# Patient Record
Sex: Male | Born: 1990 | Race: Black or African American | Hispanic: No | Marital: Single | Smoking: Never smoker
Health system: Southern US, Community
[De-identification: ages and names within clinical notes are randomized; demographics above are authoritative.]

## PROBLEM LIST (undated history)

## (undated) DIAGNOSIS — N189 Chronic kidney disease, unspecified: Secondary | ICD-10-CM

## (undated) DIAGNOSIS — I10 Essential (primary) hypertension: Secondary | ICD-10-CM

## (undated) DIAGNOSIS — E119 Type 2 diabetes mellitus without complications: Secondary | ICD-10-CM

## (undated) HISTORY — PX: EYE SURGERY: SHX253

## (undated) HISTORY — DX: Chronic kidney disease, unspecified: N18.9

## (undated) HISTORY — PX: FINGER SURGERY: SHX640

---

## 2015-06-16 ENCOUNTER — Encounter (HOSPITAL_COMMUNITY): Payer: Self-pay | Admitting: Emergency Medicine

## 2015-06-16 ENCOUNTER — Emergency Department (INDEPENDENT_AMBULATORY_CARE_PROVIDER_SITE_OTHER)
Admission: EM | Admit: 2015-06-16 | Discharge: 2015-06-16 | Disposition: A | Discharge status: Nursing Home (Includes ICF) | Payer: Medicaid Other | Source: Home / Self Care | Attending: Emergency Medicine | Admitting: Emergency Medicine

## 2015-06-16 ENCOUNTER — Emergency Department (HOSPITAL_COMMUNITY)
Admission: EM | Admit: 2015-06-16 | Discharge: 2015-06-16 | Disposition: A | Discharge status: Home / Self Care | Payer: Medicaid Other | Attending: Emergency Medicine | Admitting: Emergency Medicine

## 2015-06-16 DIAGNOSIS — E162 Hypoglycemia, unspecified: Secondary | ICD-10-CM

## 2015-06-16 DIAGNOSIS — R410 Disorientation, unspecified: Secondary | ICD-10-CM | POA: Diagnosis not present

## 2015-06-16 DIAGNOSIS — E160 Drug-induced hypoglycemia without coma: Secondary | ICD-10-CM

## 2015-06-16 DIAGNOSIS — H43392 Other vitreous opacities, left eye: Secondary | ICD-10-CM | POA: Insufficient documentation

## 2015-06-16 DIAGNOSIS — R531 Weakness: Secondary | ICD-10-CM | POA: Insufficient documentation

## 2015-06-16 DIAGNOSIS — T383X5A Adverse effect of insulin and oral hypoglycemic [antidiabetic] drugs, initial encounter: Secondary | ICD-10-CM

## 2015-06-16 DIAGNOSIS — Z794 Long term (current) use of insulin: Secondary | ICD-10-CM | POA: Diagnosis not present

## 2015-06-16 DIAGNOSIS — E11649 Type 2 diabetes mellitus with hypoglycemia without coma: Secondary | ICD-10-CM | POA: Diagnosis present

## 2015-06-16 HISTORY — DX: Type 2 diabetes mellitus without complications: E11.9

## 2015-06-16 LAB — CBG MONITORING, ED
GLUCOSE-CAPILLARY: 169 mg/dL — AB (ref 65–99)
GLUCOSE-CAPILLARY: 310 mg/dL — AB (ref 65–99)

## 2015-06-16 LAB — GLUCOSE, CAPILLARY
GLUCOSE-CAPILLARY: 57 mg/dL — AB (ref 65–99)
Glucose-Capillary: 17 mg/dL — CL (ref 65–99)
Glucose-Capillary: 25 mg/dL — CL (ref 65–99)

## 2015-06-16 MED ORDER — DEXTROSE 50 % IV SOLN
INTRAVENOUS | Status: AC
  Filled 2015-06-16: qty 50

## 2015-06-16 MED ORDER — GLUCOSE 40 % PO GEL
ORAL | Status: AC
  Filled 2015-06-16: qty 1

## 2015-06-16 MED ORDER — INSULIN GLARGINE 100 UNITS/ML SOLOSTAR PEN: 40.0000 [IU] | PEN_INJECTOR | Freq: Every day | SUBCUTANEOUS | Status: DC

## 2015-06-16 MED ORDER — GLUCOSE 40 % PO GEL
ORAL | Status: AC
Start: 2015-06-16 — End: 2015-06-16
  Filled 2015-06-16: qty 1

## 2015-06-16 MED ORDER — INSULIN ASPART 100 UNIT/ML FLEXPEN: 12.0000 [IU] | PEN_INJECTOR | Freq: Three times a day (TID) | SUBCUTANEOUS | Status: DC

## 2015-06-16 NOTE — ED Provider Notes (Signed)
CSN: [id number]     Arrival date & time 06/16/15  1401 History   First MD Initiated Contact with Patient 06/16/15 1403     Chief Complaint  Patient presents with  . Hypoglycemia   24 y/o [place] male with Type 1 Diabetes Mellitus arrived to ED after being found confused at urgent care with CBG of 17. He was given glucose paste with improvement in mental status. He reports that he followed his normal routine until last night taking 32U Lantus last night and 12U Novolog with breakfast this morning. His breakfast was 1 egg and 2 pieces of whole wheat toast. He was visiting Urgent Care for medication refills as he has no regular PCP. He has been feeling well his only complaint is worsening of his chronic poor left eyesight with increased floaters and decreased visual acuity since 2 days ago.  (Consider location/radiation/quality/duration/timing/severity/associated sxs/prior treatment) Patient is a 24 y.o. male presenting with hypoglycemia. The history is provided by the patient and medical records. The history is limited by a language barrier (Patient has fair [place] proficiency).  Hypoglycemia Initial blood sugar:  17 Blood sugar after intervention:  169 Severity:  Severe Onset quality:  Sudden Duration:  1 hour Progression:  Resolved Chronicity:  New Diabetic status:  Controlled with insulin Time since last antidiabetic medication:  6 hours Context: decreased oral intake   Relieved by:  Oral glucose and eating Associated symptoms: altered mental status, decreased responsiveness, tremors, visual change and weakness   Associated symptoms: no sweats   Altered mental status:    Severity:  Severe   Onset quality:  Sudden   Duration:  30 minutes   Progression:  Resolved   Features:  Impaired memory, impaired awareness and confusion Visual change:    Onset quality:  Unable to specify   Severity:  Unable to specify   Timing:  Unable to specify  Past Medical History  Diagnosis Date  .  Diabetes mellitus without complication    History reviewed. No pertinent past surgical history. History reviewed. No pertinent family history. Social History  Substance Use Topics  . Smoking status: Never Smoker   . Smokeless tobacco: None  . Alcohol Use: No    Review of Systems  Constitutional: Positive for decreased responsiveness. Negative for diaphoresis.  Eyes: Positive for visual disturbance.  Cardiovascular: Negative for chest pain and palpitations.  Gastrointestinal: Negative for nausea and abdominal pain.  Neurological: Positive for tremors and weakness.  Psychiatric/Behavioral: Positive for confusion.    Allergies  Review of patient's allergies indicates no known allergies.  Home Medications   Prior to Admission medications   Medication Sig Start Date End Date Taking? Authorizing Provider  insulin aspart (NOVOLOG FLEXPEN) 100 UNIT/ML FlexPen Inject 12 Units into the skin 3 (three) times daily with meals.   Yes Historical Provider, MD  insulin glargine (LANTUS) 100 unit/mL SOPN Inject 40 Units into the skin at bedtime.   Yes Historical Provider, MD   BP 136/86 mmHg  Pulse 98  Temp(Src) 98.5 F (36.9 C) (Oral)  Resp 10  Ht  (1.702 m)  Wt 153 lb (69.4 kg)  BMI 23.96 kg/m2  SpO2 100%   Physical Exam  Constitutional: He is oriented to person, place, and time.  Fit appearing  HENT:  Head: Normocephalic.  Eyes: Conjunctivae are normal. Pupils are equal, round, and reactive to light.  On nondilated fundoscopic exam, refraction defect in anterior chamber/lens is apparent.  Cardiovascular: Normal rate, regular rhythm, normal heart sounds and  intact distal pulses.  Exam reveals no gallop and no friction rub.   No murmur heard. Pulmonary/Chest: Effort normal and breath sounds normal.  Abdominal: Soft. There is no tenderness.  Musculoskeletal: Normal range of motion.  Neurological: He is alert and oriented to person, place, and time. He has normal reflexes.   Skin: Skin is warm and dry.  Psychiatric: He has a normal mood and affect.    ED Course  Procedures (including critical care time) Labs Review Labs Reviewed  CBG MONITORING, ED - Abnormal; Notable for the following:    Glucose-Capillary 169 (*)    All other components within normal limits   Imaging Review No results found. I have personally reviewed and evaluated these images and lab results as part of my medical decision-making.   EKG Interpretation None      MDM   Final diagnoses:  None   Patient is a 24 y/o type 1 diabetic on a basal-bolus regimen stable for several months with 32U lantus qHS 12U novolog TIDAC. He checks CBG before meals and most often is ~80. He was feeling well before his hypoglycemia this afternoon which developed after eating minimal food all day after taking his usual insulin doses at night and morning. Acute symptoms are fully resolved after CBG responds to oral glucose. He states this severe hypoglycemia with confusion and memory loss has not happened any other time within the past year. Patient is maintaining blood glucose with PO intake alone and will be safe to resume his management at home.  He is recommended to follow up with [facility] for his eye exam and treatment, and he has an appointment scheduled for Thursday 8/25.  He would benefit greatly from establishment with a PCP who could provide medication refills and titrate insulin as needed.  Fuller Plan, MD 06/16/15 1712  Marily Memos, MD 06/16/15 Ernestina [place]

## 2015-06-16 NOTE — ED Provider Notes (Signed)
I saw and evaluated the patient, reviewed the resident's note and I agree with the findings and plan.  24 yo M here with hypoglycemia and AMS likely 2/2 not eating in near 24 hours and standing outside to see Urgent Care providers. Tolerating PO here, CBG improved with PO medications, off of D5 drip. Will dc and ask him to take his insulin as instructed but to eat more appropriately. Will need PCP follow up of some sort.   Patient now also complaining of change in left eye vision. Intermittent floaters in that eye with a few months of worsening vision. Has an appt on Thursday for the same. EOM intact, pupillary relfex normal, no e/o cellulitis or septal cellulitis. [place] as documented below without evidence for retinal detachment, hemorrhage, dislocated lens, foreign body or other emergent causes, can continue to follow up with ophthalmologist as scheduled.   Ultrasound: Limited Ocular  Performed and interpreted by Dr Clayborne Dana Indication: left eye floaters Using high frequency linear probe, ultrasound of the globe was performed in real time in two planes with patient looking left and right if able Interpretation: No retinal detachment visualized.  Lens was in proper location.  No hemorrhage appreciated.  Images were electronically archived.     Marily Memos, MD 06/16/15 Ernestina [place]

## 2015-06-16 NOTE — ED Notes (Signed)
Per CARELINK- last night pt took 32 units, and this morning 12 units of novolog without eating. Pt reports he went to the eye clinic and does not know how he ended up at urgent care. Initial CBG 17. Gave him some oral glucose, ginger ale, peanut butter and graham crackers administered prior to arrival. Pt arrived with [facility] to [facility] with D5 .45 NS infusing.

## 2015-06-16 NOTE — ED Provider Notes (Signed)
CSN: [id number]     Arrival date & time 06/16/15  1310 History   First MD Initiated Contact with Patient 06/16/15 1338     No chief complaint on file. CC: confusion  (Consider location/radiation/quality/duration/timing/severity/associated sxs/prior Treatment) HPI He is a 24 year old man here for confusion. Patient was found outside the building when we opened. Bystanders report he had been outside the building since at least 11 AM. He reports confusion. Initially, he was alert but not responsive.  A blood sugar was checked and it was found to be 17. He was given to glucose gels with improvement in mental status.  I was able to obtain the history that he is diabetic and came here because his blood sugar was low and he was getting confused. He is not sure how he got here. He states he took 32 units of Lantus last night and 12 units of NovoLog this morning. He states he had one egg and 2 pieces of whole wheat toast for breakfast this morning.  Further history unable to be obtained due to altered mental status  No past medical history on file. No past surgical history on file. No family history on file. Social History  Substance Use Topics  . Smoking status: Not on file  . Smokeless tobacco: Not on file  . Alcohol Use: Not on file    Review of Systems  Allergies  Review of patient's allergies indicates not on file.  Home Medications   Prior to Admission medications   Not on File   BP 121/82 mmHg  Pulse 106  Temp(Src) 97.7 F (36.5 C) (Oral)  Resp 16  SpO2 100% Physical Exam  Constitutional: He appears well-developed and well-nourished.  Neck: Neck supple.  Cardiovascular: Normal rate.   Pulmonary/Chest: Effort normal.  Neurological: He is disoriented and unresponsive.    ED Course  Procedures (including critical care time) Labs Review Labs Reviewed  GLUCOSE, CAPILLARY - Abnormal; Notable for the following:    Glucose-Capillary 17 (*)    All other components within  normal limits  GLUCOSE, CAPILLARY - Abnormal; Notable for the following:    Glucose-Capillary 25 (*)    All other components within normal limits    Imaging Review No results found.   MDM   1. Hypoglycemia   2. Disorientation    Patient was given 2 oral glucose gels as well as some ginger ale. Minimal history was able to be obtained. Transfer to [facility] ER via EMS for hypoglycemia and decreased level of consciousness.    Charm Rings, MD 06/16/15 1355

## 2015-06-16 NOTE — Discharge Instructions (Signed)
Resume taking your insulin as normal starting with dinner TONIGHT. Then continue checking your blood glucose and adjusting your meal time insulin doses as needed.  Follow up with your scheduled eye clinic appointment on Thursday 8/25 for your left eye symptoms. If you suddenly develop new symptoms such as severe pain in the left eye or total blindness in a field of vision please seek medical help immediately.

## 2015-06-16 NOTE — ED Notes (Addendum)
Pt reports he initially went to urgent care initially because he needs more of his diabetes medications because he has no primary care MD. Pt reports he does not check his blood sugar prior to administering insulin.

## 2015-06-16 NOTE — ED Notes (Signed)
Pt arrived in [facility] awake and walking but unresponsive.  Unable to determine if the pt speaks English or not.

## 2015-06-16 NOTE — ED Notes (Signed)
Pt speaks [place] a language from [place].

## 2015-06-16 NOTE — ED Notes (Signed)
CBG - 310 

## 2015-06-16 NOTE — ED Notes (Signed)
Prior Blank note was written by RN, not EMT.

## 2015-06-16 NOTE — ED Notes (Signed)
CBG 169. 

## 2015-06-16 NOTE — ED Notes (Signed)
Pt being transferred to the ED via [facility].  Pt's BS was 17 on arrival.  He was awake but unresponsive.  Pt was given Glucagon, and an IV was accessed with D5 1/2 NS.  Pt is now alert and talking.  He has been given Ginger Ale and peanut butter and graham crackers.  Report was called to Freight forwarder at the ED.

## 2015-07-29 ENCOUNTER — Encounter (HOSPITAL_COMMUNITY): Payer: Self-pay | Admitting: *Deleted

## 2015-07-29 ENCOUNTER — Emergency Department (HOSPITAL_COMMUNITY)
Admission: EM | Admit: 2015-07-29 | Discharge: 2015-07-29 | Disposition: A | Discharge status: Home / Self Care | Payer: Medicaid Other | Attending: Emergency Medicine | Admitting: Emergency Medicine

## 2015-07-29 ENCOUNTER — Emergency Department (HOSPITAL_COMMUNITY): Payer: Medicaid Other

## 2015-07-29 DIAGNOSIS — R1010 Upper abdominal pain, unspecified: Secondary | ICD-10-CM | POA: Insufficient documentation

## 2015-07-29 DIAGNOSIS — Z9114 Patient's other noncompliance with medication regimen: Secondary | ICD-10-CM | POA: Insufficient documentation

## 2015-07-29 DIAGNOSIS — Z794 Long term (current) use of insulin: Secondary | ICD-10-CM | POA: Insufficient documentation

## 2015-07-29 DIAGNOSIS — R739 Hyperglycemia, unspecified: Secondary | ICD-10-CM

## 2015-07-29 DIAGNOSIS — J069 Acute upper respiratory infection, unspecified: Secondary | ICD-10-CM | POA: Insufficient documentation

## 2015-07-29 DIAGNOSIS — E1165 Type 2 diabetes mellitus with hyperglycemia: Secondary | ICD-10-CM | POA: Insufficient documentation

## 2015-07-29 LAB — URINALYSIS, ROUTINE W REFLEX MICROSCOPIC
BILIRUBIN URINE: NEGATIVE
Glucose, UA: >1000 mg/dL — AB
HGB URINE DIPSTICK: NEGATIVE
Ketones, ur: NEGATIVE mg/dL
Leukocytes, UA: NEGATIVE
Nitrite: NEGATIVE
PROTEIN: 30 mg/dL — AB
SPECIFIC GRAVITY, URINE: 1.021 (ref 1.005–1.030)
UROBILINOGEN UA: 0.2 mg/dL (ref 0.0–1.0)
pH: 6 (ref 5.0–8.0)

## 2015-07-29 LAB — URINE MICROSCOPIC-ADD ON

## 2015-07-29 LAB — CBC
HCT: 39.5 % (ref 39.0–52.0)
Hemoglobin: 13.9 g/dL (ref 13.0–17.0)
MCH: 29.9 pg (ref 26.0–34.0)
MCHC: 35.2 g/dL (ref 30.0–36.0)
MCV: 84.9 fL (ref 78.0–100.0)
Platelets: 243 10*3/uL (ref 150–400)
RBC: 4.65 MIL/uL (ref 4.22–5.81)
RDW: 12.4 % (ref 11.5–15.5)
WBC: 6.5 10*3/uL (ref 4.0–10.5)

## 2015-07-29 LAB — COMPREHENSIVE METABOLIC PANEL
ALBUMIN: 3.1 g/dL — AB (ref 3.5–5.0)
ALK PHOS: 83 U/L (ref 38–126)
ALT: 17 U/L (ref 17–63)
AST: 31 U/L (ref 15–41)
Anion gap: 10 (ref 5–15)
BILIRUBIN TOTAL: 0.4 mg/dL (ref 0.3–1.2)
BUN: 10 mg/dL (ref 6–20)
CALCIUM: 8.7 mg/dL — AB (ref 8.9–10.3)
CO2: 23 mmol/L (ref 22–32)
CREATININE: 0.92 mg/dL (ref 0.61–1.24)
Chloride: 102 mmol/L (ref 101–111)
GFR calc Af Amer: >60 mL/min (ref >60)
GFR calc non Af Amer: >60 mL/min (ref >60)
GLUCOSE: 413 mg/dL — AB (ref 65–99)
Potassium: 4.2 mmol/L (ref 3.5–5.1)
Sodium: 135 mmol/L (ref 135–145)
TOTAL PROTEIN: 5.9 g/dL — AB (ref 6.5–8.1)

## 2015-07-29 LAB — LIPASE, BLOOD: Lipase: 22 U/L (ref 22–51)

## 2015-07-29 LAB — I-STAT CHEM 8, ED
BUN: 12 mg/dL (ref 6–20)
CALCIUM ION: 1.13 mmol/L (ref 1.12–1.23)
Chloride: 99 mmol/L — ABNORMAL LOW (ref 101–111)
Creatinine, Ser: 0.7 mg/dL (ref 0.61–1.24)
Glucose, Bld: 441 mg/dL — ABNORMAL HIGH (ref 65–99)
HCT: 42 % (ref 39.0–52.0)
Hemoglobin: 14.3 g/dL (ref 13.0–17.0)
Potassium: 4.1 mmol/L (ref 3.5–5.1)
SODIUM: 136 mmol/L (ref 135–145)
TCO2: 21 mmol/L (ref 0–100)

## 2015-07-29 LAB — CBG MONITORING, ED: Glucose-Capillary: 273 mg/dL — ABNORMAL HIGH (ref 65–99)

## 2015-07-29 MED ORDER — ALBUTEROL SULFATE (2.5 MG/3ML) 0.083% IN NEBU
5.0000 mg | INHALATION_SOLUTION | Freq: Once | RESPIRATORY_TRACT | Status: AC
  Administered 2015-07-29: 5 mg via RESPIRATORY_TRACT
  Filled 2015-07-29: qty 6

## 2015-07-29 MED ORDER — INSULIN ASPART 100 UNIT/ML ~~LOC~~ SOLN: 25.0000 [IU] | Freq: Three times a day (TID) | SUBCUTANEOUS | Status: DC

## 2015-07-29 MED ORDER — INSULIN SYRINGES (DISPOSABLE) U-100 0.3 ML MISC: 1.0000 | Freq: Three times a day (TID) | Status: DC

## 2015-07-29 MED ORDER — SODIUM CHLORIDE 0.9 % IV BOLUS (SEPSIS)
1000.0000 mL | Freq: Once | INTRAVENOUS | Status: AC
  Administered 2015-07-29: 1000 mL via INTRAVENOUS

## 2015-07-29 MED ORDER — INSULIN ASPART 100 UNIT/ML ~~LOC~~ SOLN
10.0000 [IU] | Freq: Once | SUBCUTANEOUS | Status: AC
  Administered 2015-07-29: 10 [IU] via SUBCUTANEOUS
  Filled 2015-07-29: qty 1

## 2015-07-29 NOTE — ED Notes (Signed)
Pt in c/o headache and abdominal pain since yesterday, also states he is a type one diabetic and is out of his medication, last had medication yesterday, no distress noted

## 2015-07-29 NOTE — ED Provider Notes (Signed)
CSN: [id number]     Arrival date & time 07/29/15  1547 History   First MD Initiated Contact with Patient 07/29/15 1753     Chief Complaint  Patient presents with  . Headache     (Consider location/radiation/quality/duration/timing/severity/associated sxs/prior Treatment) HPI Comments: Pt comes in with c/o headache and upper abdominal pain and a cough. He states that he didn't take his insulin in the last day because he is out. He states that he has a headache. Denies n/v/d. No history of asthma. He states that the cough makes his head hurt.  The history is provided by the patient. No language interpreter was used.    Past Medical History  Diagnosis Date  . Diabetes mellitus without complication (HCC)    History reviewed. No pertinent past surgical history. History reviewed. No pertinent family history. Social History  Substance Use Topics  . Smoking status: Never Smoker   . Smokeless tobacco: None  . Alcohol Use: No    Review of Systems  All other systems reviewed and are negative.     Allergies  Review of patient's allergies indicates no known allergies.  Home Medications   Prior to Admission medications   Medication Sig Start Date End Date Taking? Authorizing Provider  insulin aspart (NOVOLOG FLEXPEN) 100 UNIT/ML FlexPen Inject 12 Units into the skin 3 (three) times daily with meals. 06/16/15   Fuller Plan, MD  insulin glargine (LANTUS) 100 unit/mL SOPN Inject 0.4 mLs (40 Units total) into the skin at bedtime. 06/16/15   Fuller Plan, MD   BP 129/78 mmHg  Pulse 94  Temp(Src) 98.5 F (36.9 C) (Oral)  Resp 20  SpO2 100% Physical Exam  Constitutional: He is oriented to person, place, and time. He appears well-developed and well-nourished.  HENT:  Head: Normocephalic and atraumatic.  Right Ear: External ear normal.  Left Ear: External ear normal.  Nose: Rhinorrhea present.  Mouth/Throat: Posterior oropharyngeal erythema present.  Eyes: Conjunctivae  and EOM are normal. Pupils are equal, round, and reactive to light.  Neck: Normal range of motion. Neck supple.  Cardiovascular: Normal rate and regular rhythm.   Pulmonary/Chest: Effort normal. He has wheezes. He has rales.  Abdominal: Soft. Bowel sounds are normal. There is no tenderness.  Neurological: He is alert and oriented to person, place, and time.  Skin: Skin is warm and dry.  Psychiatric: He has a normal mood and affect.  Nursing note and vitals reviewed.   ED Course  Procedures (including critical care time) Labs Review Labs Reviewed  COMPREHENSIVE METABOLIC PANEL - Abnormal; Notable for the following:    Glucose, Bld 413 (*)    Calcium 8.7 (*)    Total Protein 5.9 (*)    Albumin 3.1 (*)    All other components within normal limits  URINALYSIS, ROUTINE W REFLEX MICROSCOPIC (NOT AT [facility]) - Abnormal; Notable for the following:    Glucose, UA >1000 (*)    Protein, ur 30 (*)    All other components within normal limits  I-STAT CHEM 8, ED - Abnormal; Notable for the following:    Chloride 99 (*)    Glucose, Bld 441 (*)    All other components within normal limits  CBG MONITORING, ED - Abnormal; Notable for the following:    Glucose-Capillary 273 (*)    All other components within normal limits  LIPASE, BLOOD  CBC  URINE MICROSCOPIC-ADD ON    Imaging Review Dg Chest 2 View  07/29/2015   CLINICAL DATA:  Is productive cough for 2 weeks  EXAM: CHEST  2 VIEW  COMPARISON:  None.  FINDINGS: The heart size and mediastinal contours are within normal limits. Both lungs are clear. T there is scoliosis of spine.  IMPRESSION: No active cardiopulmonary disease.   Electronically Signed   By: Sherian Rein M.D.   On: 07/29/2015 19:31   I have personally reviewed and evaluated these images and lab results as part of my medical decision-making.   EKG Interpretation None      MDM   Final diagnoses:  Hyperglycemia  Non compliance w medication regimen  URI (upper respiratory  infection)    Pt feeling a lot better after treatment. No infection noted on xray. Discussed with care management about having his medications filled as he states that his medicaid has lapsed. Pt is going to be able to have his novolog filled. Spoke with pharmacy about correct dosing as pt is only going to be using the novolog. He is being taught by the nursing staff how to inject with a vial. Pt in agreement with plan. Care management is going to get him in to [employer] and wellness.    Teressa Lower, NP 07/29/15 2050  Margarita Grizzle, MD 07/30/15 [phone]

## 2015-07-29 NOTE — Discharge Instructions (Signed)
Blood Glucose Monitoring, Adult ° °Monitoring your blood glucose (also know as blood sugar) helps you to manage your diabetes. It also helps you and your health care provider monitor your diabetes and determine how well your treatment plan is working. °WHY SHOULD YOU MONITOR YOUR BLOOD GLUCOSE? °· It can help you understand how food, exercise, and medicine affect your blood glucose. °· It allows you to know what your blood glucose is at any given moment. You can quickly tell if you are having low blood glucose (hypoglycemia) or high blood glucose (hyperglycemia). °· It can help you and your health care provider know how to adjust your medicines. °· It can help you understand how to manage an illness or adjust medicine for exercise. °WHEN SHOULD YOU TEST? °Your health care provider will help you decide how often you should check your blood glucose. This may depend on the type of diabetes you have, your diabetes control, or the types of medicines you are taking. Be sure to write down all of your blood glucose readings so that this information can be reviewed with your health care provider. See below for examples of testing times that your health care provider may suggest. °Type 1 Diabetes °· Test at least 2 times per day if your diabetes is well controlled, if you are using an insulin pump, or if you perform multiple daily injections. °· If your diabetes is not well controlled or if you are sick, you may need to test more often. °· It is a good idea to also test: °¨ Before every insulin injection. °¨ Before and after exercise. °¨ Between meals and 2 hours after a meal. °¨ Occasionally between 2:00 a.m. and 3:00 a.m. °Type 2 Diabetes °· If you are taking insulin, test at least 2 times per day. However, it is best to test before every insulin injection. °· If you take medicines by mouth (orally), test 2 times a day. °· If you are on a controlled diet, test once a day. °· If your diabetes is not well controlled or if you  are sick, you may need to monitor more often. °HOW TO MONITOR YOUR BLOOD GLUCOSE °Supplies Needed °· Blood glucose meter. °· Test strips for your meter. Each meter has its own strips. You must use the strips that go with your own meter. °· A pricking needle (lancet). °· A device that holds the lancet (lancing device). °· A journal or log book to write down your results. °Procedure °· Wash your hands with soap and water. Alcohol is not preferred. °· Prick the side of your finger (not the tip) with the lancet. °· Gently milk the finger until a small drop of blood appears. °· Follow the instructions that come with your meter for inserting the test strip, applying blood to the strip, and using your blood glucose meter. °Other Areas to Get Blood for Testing °Some meters allow you to use other areas of your body (other than your finger) to test your blood. These areas are called alternative sites. The most common alternative sites are: °· The forearm. °· The thigh. °· The back area of the lower leg. °· The palm of the hand. °The blood flow in these areas is slower. Therefore, the blood glucose values you get may be delayed, and the numbers are different from what you would get from your fingers. Do not use alternative sites if you think you are having hypoglycemia. Your reading will not be accurate. Always use a finger if you   are having hypoglycemia. Also, if you cannot feel your lows (hypoglycemia unawareness), always use your fingers for your blood glucose checks. °ADDITIONAL TIPS FOR GLUCOSE MONITORING °· Do not reuse lancets. °· Always carry your supplies with you. °· All blood glucose meters have a 24-hour "hotline" number to call if you have questions or need help. °· Adjust (calibrate) your blood glucose meter with a control solution after finishing a few boxes of strips. °BLOOD GLUCOSE RECORD KEEPING °It is a good idea to keep a daily record or log of your blood glucose readings. Most glucose meters, if not all,  keep your glucose records stored in the meter. Some meters come with the ability to download your records to your home computer. Keeping a record of your blood glucose readings is especially helpful if you are wanting to look for patterns. Make notes to go along with the blood glucose readings because you might forget what happened at that exact time. Keeping good records helps you and your health care provider to work together to achieve good diabetes management.  °  °This information is not intended to replace advice given to you by your health care provider. Make sure you discuss any questions you have with your health care provider. °  °Document Released: 10/14/2003 Document Revised: 11/01/2014 Document Reviewed: 03/05/2013 °Elsevier Interactive Patient Education ©2016 Elsevier Inc. ° °

## 2015-07-29 NOTE — Progress Notes (Signed)
[facility] received phone call from NP [facility] ED regarding medication assistance.  Patient reports his Medicaid has lapsed but has re filed for [employer] yesterday.  Patient reports he does not have any insulin left.  Noted patient to take novalog and lantus insulin.  Patient has never drawn up and injected himself with insulin.  EDRN to educated patient how to draw up inject himself with insulin.  Patient entered into [facility] program for novolog insulin vial.  NP to write prescription for novolog and syringes.  Patient is agreeable to have [facility] contact [facility] in attempts to make him an appointment for follow up.  Patient provided phone number [phone].  EDCM explained that MATCH is a one time a year assist and that [facility] letter will expire in seven days.  No further EDCM needs at this time.  [facility] faxed MATCH letter and pamphlet to [facility] to [facility] ED with confirmation of receipt.

## 2015-07-29 NOTE — ED Notes (Signed)
Pt stable, ambulatory, states understanding of discharge instructions 

## 2015-07-30 ENCOUNTER — Ambulatory Visit: Payer: Medicaid Other | Attending: Family Medicine | Admitting: Family Medicine

## 2015-07-30 ENCOUNTER — Telehealth: Payer: Self-pay

## 2015-07-30 ENCOUNTER — Encounter: Payer: Self-pay | Admitting: Family Medicine

## 2015-07-30 VITALS — BP 122/76 | HR 95 | Temp 98.3°F | Resp 18 | Ht 63.5 in | Wt 128.0 lb

## 2015-07-30 DIAGNOSIS — H538 Other visual disturbances: Secondary | ICD-10-CM | POA: Insufficient documentation

## 2015-07-30 DIAGNOSIS — E109 Type 1 diabetes mellitus without complications: Secondary | ICD-10-CM | POA: Insufficient documentation

## 2015-07-30 DIAGNOSIS — R739 Hyperglycemia, unspecified: Secondary | ICD-10-CM | POA: Insufficient documentation

## 2015-07-30 LAB — GLUCOSE, POCT (MANUAL RESULT ENTRY): POC Glucose: 121 mg/dl — AB (ref 70–99)

## 2015-07-30 LAB — POCT GLYCOSYLATED HEMOGLOBIN (HGB A1C): HEMOGLOBIN A1C: 7

## 2015-07-30 MED ORDER — INSULIN ASPART 100 UNIT/ML FLEXPEN: 12.0000 [IU] | PEN_INJECTOR | Freq: Three times a day (TID) | SUBCUTANEOUS | Status: DC

## 2015-07-30 MED ORDER — INSULIN GLARGINE 100 UNIT/ML SOLOSTAR PEN: 32.0000 [IU] | PEN_INJECTOR | Freq: Every day | SUBCUTANEOUS | Status: DC

## 2015-07-30 NOTE — Patient Instructions (Signed)
Diabetes Mellitus and Food It is important for you to manage your blood sugar (glucose) level. Your blood glucose level can be greatly affected by what you eat. Eating healthier foods in the appropriate amounts throughout the day at about the same time each day will help you control your blood glucose level. It can also help slow or prevent worsening of your diabetes mellitus. Healthy eating may even help you improve the level of your blood pressure and reach or maintain a healthy weight.  General recommendations for healthful eating and cooking habits include:  Eating meals and snacks regularly. Avoid going long periods of time without eating to lose weight.  Eating a diet that consists mainly of plant-based foods, such as fruits, vegetables, nuts, legumes, and whole grains.  Using low-heat cooking methods, such as baking, instead of high-heat cooking methods, such as deep frying. Work with your dietitian to make sure you understand how to use the Nutrition Facts information on food labels. HOW CAN FOOD AFFECT ME? Carbohydrates Carbohydrates affect your blood glucose level more than any other type of food. Your dietitian will help you determine how many carbohydrates to eat at each meal and teach you how to count carbohydrates. Counting carbohydrates is important to keep your blood glucose at a healthy level, especially if you are using insulin or taking certain medicines for diabetes mellitus. Alcohol Alcohol can cause sudden decreases in blood glucose (hypoglycemia), especially if you use insulin or take certain medicines for diabetes mellitus. Hypoglycemia can be a life-threatening condition. Symptoms of hypoglycemia (sleepiness, dizziness, and disorientation) are similar to symptoms of having too much alcohol.  If your health care provider has given you approval to drink alcohol, do so in moderation and use the following guidelines:  Women should not have more than one drink per day, and men  should not have more than two drinks per day. One drink is equal to:  12 oz of beer.  5 oz of wine.  1 oz of hard liquor.  Do not drink on an empty stomach.  Keep yourself hydrated. Have water, diet soda, or unsweetened iced tea.  Regular soda, juice, and other mixers might contain a lot of carbohydrates and should be counted. WHAT FOODS ARE NOT RECOMMENDED? As you make food choices, it is important to remember that all foods are not the same. Some foods have fewer nutrients per serving than other foods, even though they might have the same number of calories or carbohydrates. It is difficult to get your body what it needs when you eat foods with fewer nutrients. Examples of foods that you should avoid that are high in calories and carbohydrates but low in nutrients include:  Trans fats (most processed foods list trans fats on the Nutrition Facts label).  Regular soda.  Juice.  Candy.  Sweets, such as cake, pie, doughnuts, and cookies.  Fried foods. WHAT FOODS CAN I EAT? Eat nutrient-rich foods, which will nourish your body and keep you healthy. The food you should eat also will depend on several factors, including:  The calories you need.  The medicines you take.  Your weight.  Your blood glucose level.  Your blood pressure level.  Your cholesterol level. You should eat a variety of foods, including:  Protein.  Lean cuts of meat.  Proteins low in saturated fats, such as fish, egg whites, and beans. Avoid processed meats.  Fruits and vegetables.  Fruits and vegetables that may help control blood glucose levels, such as apples, mangoes, and   yams.  Dairy products.  Choose fat-free or low-fat dairy products, such as milk, yogurt, and cheese.  Grains, bread, pasta, and rice.  Choose whole grain products, such as multigrain bread, whole oats, and brown rice. These foods may help control blood pressure.  Fats.  Foods containing healthful fats, such as nuts,  avocado, olive oil, canola oil, and fish. DOES EVERYONE WITH DIABETES MELLITUS HAVE THE SAME MEAL PLAN? Because every person with diabetes mellitus is different, there is not one meal plan that works for everyone. It is very important that you meet with a dietitian who will help you create a meal plan that is just right for you.   This information is not intended to replace advice given to you by your health care provider. Make sure you discuss any questions you have with your health care provider.   Document Released: 07/08/2005 Document Revised: 11/01/2014 Document Reviewed: 09/07/2013 Elsevier Interactive Patient Education 2016 Elsevier Inc.  

## 2015-07-30 NOTE — Progress Notes (Signed)
CC:  HPI: Antonio Martin is a 24 y.o. male who relocated to the [place] 8 months ago with a history of Type 1 Diabetes Mellitus diagnosed when he was 24 years old who comes in to the clinic for a follow up from [facility] ED where he had presented with abdominal pain and cough and blood sugar was 441.  He had a chest x-ray which was negative for any acute cardiopulmonary process; he was diagnosed with a URI and advised to use OTC medication. He also received a prescription for NovoLog vial and received teaching regarding administration.  Today in the clinic he informs me he had blurry vision after administering the NovoLog he received from the ED but also goes on to inform me that he sugar was in the 300s at the time; he has a hard time drawing from the vial as he has been used to the pain in the past. Since he has been in the [place] he has been seeing endocrinology were he was placed on Lantus 32 units at bedtime and Novolog 12 units three times daily but he recently lost his Medicaid.  Patient has No headache, No chest pain, No abdominal pain - No Nausea, No new weakness tingling or numbness, mild Cough - no SOB.  No Known Allergies Past Medical History  Diagnosis Date  . Diabetes mellitus without complication [facility])    Current Outpatient Prescriptions on File Prior to Visit  Medication Sig Dispense Refill  . insulin aspart (NOVOLOG FLEXPEN) 100 UNIT/ML FlexPen Inject 12 Units into the skin 3 (three) times daily with meals. 15 mL 1  . insulin aspart (NOVOLOG) 100 UNIT/ML injection Inject 25 Units into the skin 3 (three) times daily with meals. 10 mL 0  . insulin glargine (LANTUS) 100 unit/mL SOPN Inject 0.4 mLs (40 Units total) into the skin at bedtime. 15 mL 1  . Insulin Syringes, Disposable, U-100 0.3 ML MISC 1 Syringe by Does not apply route 3 (three) times daily with meals. 90 each 0   No current facility-administered medications on file prior to visit.   History reviewed. No pertinent  family history. Social History   Social History  . Marital Status: Single    Spouse Name: N/A  . Number of Children: N/A  . Years of Education: N/A   Occupational History  . Not on file.   Social History Main Topics  . Smoking status: Never Smoker   . Smokeless tobacco: Not on file  . Alcohol Use: No  . Drug Use: No  . Sexual Activity: Yes   Other Topics Concern  . Not on file   Social History Narrative    Review of Systems: Constitutional: Negative for fever, chills, diaphoresis, activity change, appetite change and fatigue. HENT: Negative for ear pain, nosebleeds, congestion, facial swelling, rhinorrhea, neck pain, neck stiffness and ear discharge.  Eyes: blurry vision Respiratory: Negative for cough, choking, chest tightness, shortness of breath, wheezing and stridor.  Cardiovascular: Negative for chest pain, palpitations and leg swelling. Gastrointestinal: Negative for abdominal distention. Genitourinary: Negative for dysuria, urgency, frequency, hematuria, flank pain, decreased urine volume, difficulty urinating and dyspareunia.  Musculoskeletal: Negative for back pain, joint swelling, arthralgias and gait problem. Neurological: Negative for dizziness, tremors, seizures, syncope, facial asymmetry, speech difficulty, weakness, light-headedness, numbness and headaches.  Hematological: Negative for adenopathy. Does not bruise/bleed easily. Psychiatric/Behavioral: Negative for hallucinations, behavioral problems, confusion, dysphoric mood, decreased concentration and agitation.    Objective:   Filed Vitals:   07/30/15 1513  BP: 122/76  Pulse: 95  Temp: 98.3 F (36.8 C)  Resp: 18    Physical Exam: Constitutional: Patient appears well-developed and well-nourished. No distress. HENT: Normocephalic, atraumatic, External right and left ear normal. Oropharynx is clear and moist.  Eyes: Conjunctivae and EOM are normal. PERRLA, no scleral icterus. Neck: Normal ROM. Neck  supple. No JVD. No tracheal deviation. No thyromegaly. CVS: RRR, S1/S2 +, no murmurs, no gallops, no carotid bruit.  Pulmonary: Effort and breath sounds normal, no stridor, rhonchi, wheezes, rales.  Abdominal: Soft. BS +,  no distension, tenderness, rebound or guarding.  Musculoskeletal: Normal range of motion. No edema and no tenderness.  Lymphadenopathy: No lymphadenopathy noted, cervical, inguinal or axillary Neuro: Alert. Normal reflexes, muscle tone coordination. No cranial nerve deficit. Skin: Skin is warm and dry. No rash noted. Not diaphoretic. No erythema. No pallor. Psychiatric: Normal mood and affect. Behavior, judgment, thought content normal.  Lab Results  Component Value Date   WBC 6.5 07/29/2015   HGB 13.9 07/29/2015   HCT 39.5 07/29/2015   MCV 84.9 07/29/2015   PLT 243 07/29/2015   Lab Results  Component Value Date   CREATININE 0.92 07/29/2015   BUN 10 07/29/2015   NA 135 07/29/2015   K 4.2 07/29/2015   CL 102 07/29/2015   CO2 23 07/29/2015    Lab Results  Component Value Date   HGBA1C 7.0 07/30/2015      Assessment and plan:  Type 1 DM: Controlled with A1c of 7. He has complained of cost and so I am sending prescriptions to the pharmacy in-house for Lantus solostar Pen and NovoLog flex pen We will try to give him samples from the pharmacy today. I will reassess his blood sugars at his next visit  Blurry vision: Patient states this started when he took his Novolog but I am the opinion that it is due to his hyperglycemia. He already has an appointment with an ophthalmologist which I have advised him to keep.      Jaclyn Shaggy, MD. [facility] [phone] 07/30/2015, 3:42 PM

## 2015-07-30 NOTE — Telephone Encounter (Signed)
Referral received from Radford Pax, RN CM requesting a follow up appointment for the patient.  Call placed to the patient and it was determined that his preferred language is [place].  Informed him that this CM would call him back with an interpreter.  Another call was then placed to the patient with the assistance of Asmelash # [phone] , [place] Interpreter with [employer]. The reason for the call was explained as well as the services provided at [facility].  Explained the pharmacy assistance services as well as financial counseling.  He stated that his medicaid expired and he needs to re-apply.  Explained that the patient could have his prescriptions filled at [facility] but he stated that he was going to have them filled at [facility], noting  that he needs to get his insulin. He also explained that his eye sight is limited and he has problems reading and he is not able to get a job and he would like to find an eye doctor. This CM explained to him the importance of seeing a doctor as soon as possible to review his medications/insulin with him.  He was agreeable to scheduling an appointment today at [facility] and and appointment was scheduled for today at 1500.  He said that he would be there and was able to repeat back the address noting that he would take a bus to the clinic.  No other questions or concerns reported at this time.   Update provided to A. Bennie Dallas, RN CM.

## 2015-07-30 NOTE — Progress Notes (Signed)
Pt's here for ED f/up for hyperglycemia. Pt reports having blurry vision since today when he ran out of insulin med's, No pain reported..  Pt reports taken meds.

## 2015-08-04 ENCOUNTER — Ambulatory Visit: Payer: Medicaid Other

## 2015-08-13 ENCOUNTER — Ambulatory Visit: Payer: Medicaid Other | Attending: Family Medicine | Admitting: Family Medicine

## 2015-08-13 ENCOUNTER — Encounter: Payer: Self-pay | Admitting: Family Medicine

## 2015-08-13 VITALS — BP 129/85 | HR 94 | Temp 97.7°F | Resp 18 | Ht 63.0 in | Wt 125.0 lb

## 2015-08-13 DIAGNOSIS — H538 Other visual disturbances: Secondary | ICD-10-CM | POA: Insufficient documentation

## 2015-08-13 DIAGNOSIS — E109 Type 1 diabetes mellitus without complications: Secondary | ICD-10-CM

## 2015-08-13 LAB — GLUCOSE, POCT (MANUAL RESULT ENTRY): POC GLUCOSE: 53 mg/dL — AB (ref 70–99)

## 2015-08-13 MED ORDER — INSULIN GLARGINE 100 UNIT/ML SOLOSTAR PEN: 38.0000 [IU] | PEN_INJECTOR | Freq: Every day | SUBCUTANEOUS | Status: DC

## 2015-08-13 MED ORDER — INSULIN ASPART 100 UNIT/ML FLEXPEN: 6.0000 [IU] | PEN_INJECTOR | Freq: Three times a day (TID) | SUBCUTANEOUS | Status: DC

## 2015-08-13 NOTE — Progress Notes (Signed)
Pt's here for DM f/up. Pt 's reports his R eye is blurry for past 16 yrs. Pt states he's taken meds today. Pt requesting meds refill. Pt's medicaid has expired and he has reapplied and hasn't heard from them yet.   Pt was to do an eye exam and refused stating that he can't not see.   Pt states that he currently wearing reading glasses and I suggest that he need to see an Opthalmology to for rx'd glasses. Per Dr Venetia NightAmao he has chooses to go for people without insurance.  No insurance for Patients: [facility]

## 2015-08-13 NOTE — Patient Instructions (Signed)
Diabetes Mellitus and Food It is important for you to manage your blood sugar (glucose) level. Your blood glucose level can be greatly affected by what you eat. Eating healthier foods in the appropriate amounts throughout the day at about the same time each day will help you control your blood glucose level. It can also help slow or prevent worsening of your diabetes mellitus. Healthy eating may even help you improve the level of your blood pressure and reach or maintain a healthy weight.  General recommendations for healthful eating and cooking habits include:  Eating meals and snacks regularly. Avoid going long periods of time without eating to lose weight.  Eating a diet that consists mainly of plant-based foods, such as fruits, vegetables, nuts, legumes, and whole grains.  Using low-heat cooking methods, such as baking, instead of high-heat cooking methods, such as deep frying. Work with your dietitian to make sure you understand how to use the Nutrition Facts information on food labels. HOW CAN FOOD AFFECT ME? Carbohydrates Carbohydrates affect your blood glucose level more than any other type of food. Your dietitian will help you determine how many carbohydrates to eat at each meal and teach you how to count carbohydrates. Counting carbohydrates is important to keep your blood glucose at a healthy level, especially if you are using insulin or taking certain medicines for diabetes mellitus. Alcohol Alcohol can cause sudden decreases in blood glucose (hypoglycemia), especially if you use insulin or take certain medicines for diabetes mellitus. Hypoglycemia can be a life-threatening condition. Symptoms of hypoglycemia (sleepiness, dizziness, and disorientation) are similar to symptoms of having too much alcohol.  If your health care provider has given you approval to drink alcohol, do so in moderation and use the following guidelines:  Women should not have more than one drink per day, and men  should not have more than two drinks per day. One drink is equal to:  12 oz of beer.  5 oz of wine.  1 oz of hard liquor.  Do not drink on an empty stomach.  Keep yourself hydrated. Have water, diet soda, or unsweetened iced tea.  Regular soda, juice, and other mixers might contain a lot of carbohydrates and should be counted. WHAT FOODS ARE NOT RECOMMENDED? As you make food choices, it is important to remember that all foods are not the same. Some foods have fewer nutrients per serving than other foods, even though they might have the same number of calories or carbohydrates. It is difficult to get your body what it needs when you eat foods with fewer nutrients. Examples of foods that you should avoid that are high in calories and carbohydrates but low in nutrients include:  Trans fats (most processed foods list trans fats on the Nutrition Facts label).  Regular soda.  Juice.  Candy.  Sweets, such as cake, pie, doughnuts, and cookies.  Fried foods. WHAT FOODS CAN I EAT? Eat nutrient-rich foods, which will nourish your body and keep you healthy. The food you should eat also will depend on several factors, including:  The calories you need.  The medicines you take.  Your weight.  Your blood glucose level.  Your blood pressure level.  Your cholesterol level. You should eat a variety of foods, including:  Protein.  Lean cuts of meat.  Proteins low in saturated fats, such as fish, egg whites, and beans. Avoid processed meats.  Fruits and vegetables.  Fruits and vegetables that may help control blood glucose levels, such as apples, mangoes, and   yams.  Dairy products.  Choose fat-free or low-fat dairy products, such as milk, yogurt, and cheese.  Grains, bread, pasta, and rice.  Choose whole grain products, such as multigrain bread, whole oats, and brown rice. These foods may help control blood pressure.  Fats.  Foods containing healthful fats, such as nuts,  avocado, olive oil, canola oil, and fish. DOES EVERYONE WITH DIABETES MELLITUS HAVE THE SAME MEAL PLAN? Because every person with diabetes mellitus is different, there is not one meal plan that works for everyone. It is very important that you meet with a dietitian who will help you create a meal plan that is just right for you.   This information is not intended to replace advice given to you by your health care provider. Make sure you discuss any questions you have with your health care provider.   Document Released: 07/08/2005 Document Revised: 11/01/2014 Document Reviewed: 09/07/2013 Elsevier Interactive Patient Education 2016 Elsevier Inc.  

## 2015-08-13 NOTE — Progress Notes (Signed)
Subjective:    Patient ID: Antonio Martin, male    DOB: 06/17/1991, 24 y.o.   MRN: [id number]  HPI Antonio Martin is a 24 year old male who relocated to the [place] 8 months ago with a history of Type 1 Diabetes Mellitus diagnosed when he was 24 years old who comes in to the clinic for a follow up visit.  He was [facility] at [facility]  for a URI and hyperglycemia.  At his last visit he informed me he had blurry vision after administering the NovoLog he received from the ED but also went on to inform me that his sugar were in the 300s at the time; he has a hard time drawing from the vial as he has been used to the pen in the past. [place] been in the [place] he has been [facility] were he was placed on Lantus 32 units at bedtime and Novolog 12 units three times daily but he recently lost his Medicaid.  He complains of being unable to see from his right eye and sees  from the left and sometimes the left vision is blurry; this has been ongoing for the last 6 weeks he says.. He has been to see an ophthalmologist but due to the fact that he lost his Medicaid he is not sure he will be able to receive care there anymore. Patient has No headache, No chest pain, No abdominal pain - No Nausea, No new weakness tingling or numbness, mild Cough - no SOB.   Past Medical History  Diagnosis Date  . Diabetes mellitus without complication (HCC)     No past surgical history on file.  No Known Allergies  Current Outpatient Prescriptions on File Prior to Visit  Medication Sig Dispense Refill  . insulin aspart (NOVOLOG FLEXPEN) 100 UNIT/ML FlexPen Inject 12 Units into the skin 3 (three) times daily with meals. 15 mL 3  . Insulin Glargine (LANTUS SOLOSTAR) 100 UNIT/ML Solostar Pen Inject 32 Units into the skin daily at 10 pm. 5 pen 2  . Insulin Syringes, Disposable, U-100 0.3 ML MISC 1 Syringe by Does not apply route 3 (three) times daily with meals. 90 each 0   No current  facility-administered medications on file prior to visit.     Review of Systems Constitutional: Negative for fever, chills, diaphoresis, activity change, appetite change and fatigue. HENT: Negative for ear pain, nosebleeds, congestion, facial swelling, rhinorrhea, neck pain, neck stiffness and ear discharge.  Eyes: blurry vision Respiratory: Negative for cough, choking, chest tightness, shortness of breath, wheezing and stridor.  Cardiovascular: Negative for chest pain, palpitations and leg swelling. Gastrointestinal: Negative for abdominal distention. Genitourinary: Negative for dysuria, urgency, frequency, hematuria, flank pain, decreased urine volume, difficulty urinating and dyspareunia.  Musculoskeletal: Negative for back pain, joint swelling, arthralgias and gait problem. Neurological: Negative for dizziness, tremors, seizures, syncope, facial asymmetry, speech difficulty, weakness, light-headedness, numbness and headaches.  Hematological: Negative for adenopathy. Does not bruise/bleed easily. Psychiatric/Behavioral: Negative for hallucinations, behavioral problems, confusion, dysphoric mood, decreased concentration and agitation.     Objective:   Physical Exam Constitutional: Patient appears well-developed and well-nourished. No distress. HENT: Normocephalic, atraumatic, External right and left ear normal. Oropharynx is clear and moist.  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 Neck supple. No JVD. No tracheal deviation. No thyromegaly. CVS: RRR, S1/S2 +, no murmurs,  no gallops, no carotid bruit.  Pulmonary: Effort and breath sounds normal, no stridor, rhonchi, wheezes, rales.  Abdominal: Soft. BS +,  no distension, tenderness, rebound or guarding.  Musculoskeletal: Normal range of motion. No edema and no tenderness.  Lymphadenopathy: No lymphadenopathy noted, cervical,  inguinal or axillary Neuro: Alert. Normal reflexes, muscle tone coordination. No cranial nerve deficit. Skin: Skin is warm and dry. No rash noted. Not diaphoretic. No erythema. No pallor. Psychiatric: Normal mood and affect. Behavior, judgment, thought content normal.       Assessment & Plan:  Type 1 DM: Controlled with A1c of 7. Based on elevated fasting sugars in the range of 153 to 285 I am increasing his Lantus from 32 units at bedtime to 38 units Postprandial sugars and noticeably in the hypoglycemic range of 46, 53 and so I am decreasing his NovoLog to 6 units 3 times daily.  Blurry vision: He was unable to perform a visual acuity even with his glasses; unable to count fingers on the right, able to do so on the left Patient states this started when he took his Novolog  He already has an appointment with an ophthalmologist which is not until the end of the month and so I have explained to him that given the urgency of the situation and he would need to walk-in to see and optometrist or ophthalmologist and I have given him available resources in the community especially since he has no insurance and he will need to go to the ED if symptoms persist.

## 2015-08-14 LAB — MICROALBUMIN / CREATININE URINE RATIO
CREATININE, URINE: 117 mg/dL (ref 20–370)
MICROALB UR: 157.8 mg/dL
Microalb Creat Ratio: 1349 mcg/mg creat — ABNORMAL HIGH (ref <30)

## 2015-08-20 ENCOUNTER — Telehealth: Payer: Self-pay

## 2015-08-20 NOTE — Telephone Encounter (Addendum)
CMA called [place] interpreter, interpreter name Alba Corylsa [phone]. Pt was given lab results and pt stated he understood his results.

## 2015-08-20 NOTE — Telephone Encounter (Signed)
-----   Message from Jaclyn ShaggyEnobong Amao, MD sent at 08/14/2015  9:22 AM EDT ----- Lab results reveal he has some microalbuminuria which is secondary to his diabetes.

## 2015-09-03 ENCOUNTER — Encounter: Payer: Self-pay | Admitting: Internal Medicine

## 2015-09-03 ENCOUNTER — Ambulatory Visit: Payer: Medicaid Other | Attending: Internal Medicine | Admitting: Internal Medicine

## 2015-09-03 VITALS — BP 142/90 | HR 99 | Temp 98.0°F | Resp 16 | Ht 64.0 in | Wt 127.0 lb

## 2015-09-03 DIAGNOSIS — E119 Type 2 diabetes mellitus without complications: Secondary | ICD-10-CM | POA: Insufficient documentation

## 2015-09-03 DIAGNOSIS — Z794 Long term (current) use of insulin: Secondary | ICD-10-CM | POA: Insufficient documentation

## 2015-09-03 DIAGNOSIS — R809 Proteinuria, unspecified: Secondary | ICD-10-CM | POA: Diagnosis not present

## 2015-09-03 LAB — GLUCOSE, POCT (MANUAL RESULT ENTRY): POC GLUCOSE: 200 mg/dL — AB (ref 70–99)

## 2015-09-03 MED ORDER — LISINOPRIL 2.5 MG PO TABS: 2.5000 mg | ORAL_TABLET | Freq: Every day | ORAL | Status: DC

## 2015-09-03 MED ORDER — INSULIN GLARGINE 100 UNIT/ML SOLOSTAR PEN: 31.0000 [IU] | PEN_INJECTOR | Freq: Every day | SUBCUTANEOUS | Status: DC

## 2015-09-03 MED ORDER — INSULIN ASPART 100 UNIT/ML FLEXPEN: 12.0000 [IU] | PEN_INJECTOR | Freq: Three times a day (TID) | SUBCUTANEOUS | Status: DC

## 2015-09-03 NOTE — Progress Notes (Addendum)
Patient ID: Antonio Martin, male   DOB: 03/30/1991, 24 y.o.   MRN: [id number] SUBJECTIVE: 24 y.o. male for follow up of diabetes. He ran out of strips to check his sugars 10 days go. He states that he has continued to take 32 units of lantus at bedtime and 12 units with meals. He states that when he decreased his meal time insulin his sugars became very elevated.  He does notice some low sugars below 70 about twice per week when he exercises before meals. He states that he does not use his Novolog when his sugars are low.  Current Outpatient Prescriptions  Medication Sig Dispense Refill  . insulin aspart (NOVOLOG FLEXPEN) 100 UNIT/ML FlexPen Inject 6 Units into the skin 3 (three) times daily with meals. 15 mL 3  . Insulin Glargine (LANTUS SOLOSTAR) 100 UNIT/ML Solostar Pen Inject 38 Units into the skin daily at 10 pm. 5 pen 2  . Insulin Syringes, Disposable, U-100 0.3 ML MISC 1 Syringe by Does not apply route 3 (three) times daily with meals. 90 each 0   No current facility-administered medications for this visit.   Review of Systems  All other systems reviewed and are negative.   OBJECTIVE: Appearance: alert, well appearing, and in no distress, oriented to person, place, and time and normal appearing weight. BP 142/90 mmHg  Pulse 99  Temp(Src) 98 F (36.7 C)  Resp 16  Ht 5\' 4"  (1.626 m)  Wt 127 lb (57.607 kg)  BMI 21.79 kg/m2  SpO2 100%  Exam: heart sounds normal rate, regular rhythm, normal S1, S2, no murmurs, rubs, clicks or gallops, no JVD, chest clear, no carotid bruits  ASSESSMENT: Zi was seen today for follow-up.  Diagnoses and all orders for this visit:  Type 2 diabetes mellitus without complication, with long-term current use of insulin (HCC) -     Glucose (CBG) -     insulin aspart (NOVOLOG FLEXPEN) 100 UNIT/ML FlexPen; Inject 12 Units into the skin 3 (three) times daily with meals. -     Insulin Glargine (LANTUS SOLOSTAR) 100 UNIT/ML Solostar Pen; Inject 31  Units into the skin daily at 10 pm. Due to language barrier patient affecting understanding and compliance, I will let him continue Novolog 12 units with meals and have him decrease his Lantus by 1-2 units per night. I will have him to come back with log book for review in 2 weeks.   Microalbuminuria -    Begin lisinopril (ZESTRIL) 2.5 MG tablet; Take 1 tablet (2.5 mg total) by mouth daily.  Return in about 2 weeks (around 09/17/2015) for Nurse Visit-log review and 3 mo PCP .  Ambrose FinlandValerie A Keck, NP 09/07/2015 10:12 PM

## 2015-09-03 NOTE — Progress Notes (Signed)
Patient here for follow up on his diabetes Patient has been insulin dependent since the age of [age]

## 2015-09-17 ENCOUNTER — Encounter: Payer: Medicaid Other | Admitting: Pharmacist

## 2015-10-02 ENCOUNTER — Encounter: Payer: Self-pay | Admitting: Pharmacist

## 2015-10-02 ENCOUNTER — Ambulatory Visit: Payer: Medicaid Other | Attending: Family Medicine | Admitting: Pharmacist

## 2015-10-02 VITALS — BP 138/84 | HR 92

## 2015-10-02 DIAGNOSIS — Z794 Long term (current) use of insulin: Secondary | ICD-10-CM | POA: Diagnosis not present

## 2015-10-02 DIAGNOSIS — E119 Type 2 diabetes mellitus without complications: Secondary | ICD-10-CM | POA: Insufficient documentation

## 2015-10-02 DIAGNOSIS — E109 Type 1 diabetes mellitus without complications: Secondary | ICD-10-CM

## 2015-10-02 MED ORDER — TRUE METRIX METER W/DEVICE KIT: PACK | Status: DC

## 2015-10-02 MED ORDER — TRUEPLUS LANCETS 28G MISC: Status: DC

## 2015-10-02 MED ORDER — GLUCOSE BLOOD VI STRP: ORAL_STRIP | Status: DC

## 2015-10-02 NOTE — Patient Instructions (Signed)
Thanks for coming to see me!  Keep taking your insulin as directed - let us know if you continue to have low blood sugars  Pick up the new meter at our pharmacy  Come back and see me in 2-3 weeks with your blood sugar readings

## 2015-10-02 NOTE — Progress Notes (Addendum)
S:    Patient arrives in good spirits.  Presents for diabetes follow up.  Patient reports adherence with medications. Current diabetes medications include Lantus 31 units at night and Novolog 12 units TID with meals. He never started the lisinopril.  Patient reports hypoglycemic events. He reports that he has had to call the EMS in the past due to hypoglycemia.   Patient reported dietary habits:  Patient reported exercise habits:    Patient reports nocturia.  Patient denies neuropathy. Patient denies visual changes. Patient reports self foot exams.   Patient reports that he cannot afford his strips for his meter. He is very frustrated because where he came from, all of his medications and meter supplies were free. He reports that he is waiting to be approved for Medicaid.  Patient is interested in having a medical bracelet to identify him as a Type 1 diabetic. He is worried that if he ever needs help, that the people helping him will not know that he has diabetes.    O:  Lab Results  Component Value Date   HGBA1C 7.0 07/30/2015   Very few readings on meter (readings from the last few weeks):  Fasting CBGs: 230, 286, 175, 201 Random CBGs: 56, 92  Patient refused to have blood glucose checked in office  A/P: Diabetes currently uncontrolled based on home CBGs.   Patient reports hypoglycemic events and is able to verbalize appropriate hypoglycemia management plan. I v  Patient reports adherence with medication. Control is suboptimal due to inadequate insulin dosing and monitoring.  Continued basal insulin Lantus (insulin glargine) 31 units daily. Continued rapid insulin Novolog (insulin aspart) to 12 units TID with meals.  I don't have enough readings to adjust his medications at this time. Ordered patient a new meter and supplies that would be cheaper than the Accu-chek to hold him over until his Medicaid application is approved. I stressed the importance of getting the meter and  strips for safety reasons, especially since patient has had hypoglycemia recently. Will discuss medical alert bracelet with Holland CommonsValerie Keck, NP.   Next A1C anticipated January 2017.    Written patient instructions provided.  Total time in face to face counseling 20 minutes.  Follow up in Pharmacist Clinic Visit in 2-3 weeks.

## 2015-10-23 ENCOUNTER — Ambulatory Visit: Payer: Medicaid Other | Attending: Family Medicine | Admitting: Pharmacist

## 2015-10-23 DIAGNOSIS — Z794 Long term (current) use of insulin: Secondary | ICD-10-CM | POA: Diagnosis present

## 2015-10-23 DIAGNOSIS — E119 Type 2 diabetes mellitus without complications: Secondary | ICD-10-CM | POA: Insufficient documentation

## 2015-10-23 MED ORDER — INSULIN ASPART 100 UNIT/ML FLEXPEN: PEN_INJECTOR | SUBCUTANEOUS | Status: DC

## 2015-10-23 NOTE — Progress Notes (Signed)
S:    Patient arrives in good spirits.  Presents for diabetes follow up.  Patient reports adherence with medications. Current diabetes medications include Lantus 31 units at night and Novolog 12-14 units TID with meals. He will take 12 even if his blood sugar is lower and if it is >200, he will take 14 units. He never started the lisinopril.  Patient reports hypoglycemic events. He reports that he has had to call the EMS in the past due to hypoglycemia.   Patient reported dietary habits: eats what is available to him  Patient reported exercise habits: walks   Patient reports nocturia.  Patient denies neuropathy. Patient denies visual changes. Patient reports self foot exams.   Patient had a friend buy him the strips for his meter.  Patient is interested in having a medical bracelet to identify him as a Type 1 diabetic. He is worried that if he ever needs help, that the people helping him will not know that he has diabetes.    O:  Lab Results  Component Value Date   HGBA1C 7.0 07/30/2015   Fasting CBGs: 48 - 273 (most <150) Random CBGs: 41 - 400s  Patient refused to have blood glucose checked in office  A/P: Diabetes currently uncontrolled based on home CBGs.   Patient reports hypoglycemic events and is able to verbalize appropriate hypoglycemia management plan. Patient reports adherence with medication. Control is suboptimal due to inadequate insulin dosing and monitoring.  His range of blood glucose is very large, and this is very dangerous for him. I tried to explain that we need to have less variations in his blood glucose to decrease the risk of diabetic complications. It is also very dangerous to have hypoglycemia and he is having a lot of hypoglycemic episodes. He verbalized understanding but did not want to engage in conversations in how to prevent the frequent hypoglycemia.  Continued basal insulin Lantus (insulin glargine) 31 units daily. Decreased rapid insulin Novolog  (insulin aspart) to 10 units TID with meals. If patient has a blood glucose over 200, then he can take 14 units. Patient was not happy with the decrease in insulin and though I was adamant that he needed to stop the hypoglycemic episodes, I am worried he will continue to take the insulin however he wants to.   Patient is Medicaid pending, I think he would benefit from an endocrinology referral once he has Medicaid.   Patient requests again for a medical bracelet for safety reasons. Will discuss with Holland CommonsValerie Keck, NP.  Next A1C anticipated January 2017.    Written patient instructions provided.  Total time in face to face counseling 20 minutes.  Follow up in Pharmacist Clinic Visit in 2-3 weeks.

## 2015-10-23 NOTE — Patient Instructions (Signed)
Thanks for coming to see me!  Start taking Novolog 10 units before meals, and if your blood sugar is higher than 200, take 14 units like you have been.  Come back and see me in 2-3 weeks.

## 2015-11-13 ENCOUNTER — Ambulatory Visit: Payer: Medicaid Other | Attending: Internal Medicine | Admitting: Pharmacist

## 2015-11-13 DIAGNOSIS — E119 Type 2 diabetes mellitus without complications: Secondary | ICD-10-CM

## 2015-11-13 DIAGNOSIS — E1165 Type 2 diabetes mellitus with hyperglycemia: Secondary | ICD-10-CM | POA: Diagnosis not present

## 2015-11-13 DIAGNOSIS — Z794 Long term (current) use of insulin: Secondary | ICD-10-CM | POA: Diagnosis not present

## 2015-11-13 DIAGNOSIS — E109 Type 1 diabetes mellitus without complications: Secondary | ICD-10-CM

## 2015-11-13 LAB — GLUCOSE, POCT (MANUAL RESULT ENTRY)
POC GLUCOSE: 44 mg/dL — AB (ref 70–99)
POC GLUCOSE: 49 mg/dL — AB (ref 70–99)
POC Glucose: 74 mg/dl (ref 70–99)

## 2015-11-13 MED ORDER — INSULIN GLARGINE 100 UNIT/ML SOLOSTAR PEN: 31.0000 [IU] | PEN_INJECTOR | Freq: Every day | SUBCUTANEOUS | Status: DC

## 2015-11-13 MED FILL — !NOVOLOG FLEXPEN SYRINGE 1: 100/ML | 8 days supply | Qty: 3 | Fill #1

## 2015-11-13 NOTE — Progress Notes (Signed)
S:    Patient arrives in good spirits.  Presents for diabetes follow up.  Patient reports adherence with medications. Current diabetes medications include Lantus 31 units at night and Novolog 12-14 units TID with meals (despite me telling him to decrease his dose). He will take 12 even if his blood sugar is lower and if it is >200, he will take 14 units. He never started the lisinopril.  Patient reports hypoglycemic events. He reports that he has had to call the EMS in the past due to hypoglycemia.   Patient reported dietary habits: eats what is available to him  Patient reported exercise habits: walks   Patient reports nocturia.  Patient denies neuropathy. Patient denies visual changes. Patient reports self foot exams.    Patient is interested in having a medical bracelet to identify him as a Type 1 diabetic. He is worried that if he ever needs help, that the people helping him will not know that he has diabetes.    O:  Lab Results  Component Value Date   HGBA1C 7.0 07/30/2015   Home reported CBGs 40s - 300s  POCT glucose = 44;49;74  A/P: Diabetes currently uncontrolled based on home CBGs.   Patient reports hypoglycemic events and is able to verbalize appropriate hypoglycemia management plan. Patient reports adherence with medication. Control is suboptimal due to inadequate insulin dosing and monitoring.  Provided him with a can of regular coke for the hypoglycemia. He had no symptoms of hypoglycemia and this is very dangerous. I do not know how to get through to him the dangers of taking too much insulin and having this huge fluctuations in his blood glucose constantly. Reviewed the dangers of hypoglycemia and appropriate insulin use with patient though he reported that he would not change his insulin dose because he still has high blood sugars and he knows how to manage his diabetes.   Continued basal insulin Lantus (insulin glargine) 31 units daily. Continued rapid insulin  Novolog (insulin aspart) at 10 units TID with meals. Stressed to him not to take 14 units of Novolog as I think that this is contributing significantly to his hypoglycemia. Also provided patient with glucose gel in case he becomes hypoglycemic again.   Patient is Medicaid pending, I think he needs an endocrinology referral once he has Medicaid. However, will have him meet with financial counseling to try to get the [facility] in the meantime to get him in to see endocrinology as soon as possible.   Provided information on medical alert bracelets.  Next A1C anticipated January 2017.    Written patient instructions provided.  Total time in face to face counseling 20 minutes.  Follow up with Holland Commons, NP, for next visit as he will be due for his 3 month follow up.

## 2015-11-13 NOTE — Patient Instructions (Addendum)
Thank you for coming to see me today!  Make an appointment with financial counseling.  PLEASE monitor your blood sugar closely today and drink/eat to get it back up.  Make an appointment to see Holland Commons for your next visit - you are due for your 3 month follow up appointment.

## 2015-11-24 ENCOUNTER — Ambulatory Visit: Payer: Medicaid Other | Admitting: Internal Medicine

## 2015-11-24 MED FILL — !NOVOLOG FLEXPEN SYRINGE 1: 100/ML | 40 days supply | Qty: 15 | Fill #2

## 2015-12-01 ENCOUNTER — Encounter: Payer: Self-pay | Admitting: Internal Medicine

## 2015-12-01 ENCOUNTER — Ambulatory Visit: Payer: Medicaid Other | Attending: Internal Medicine | Admitting: Internal Medicine

## 2015-12-01 ENCOUNTER — Other Ambulatory Visit: Payer: Self-pay | Admitting: Internal Medicine

## 2015-12-01 VITALS — BP 144/85 | HR 100 | Temp 98.0°F | Resp 17 | Ht 64.0 in | Wt 134.0 lb

## 2015-12-01 DIAGNOSIS — N529 Male erectile dysfunction, unspecified: Secondary | ICD-10-CM | POA: Diagnosis not present

## 2015-12-01 DIAGNOSIS — E109 Type 1 diabetes mellitus without complications: Secondary | ICD-10-CM | POA: Diagnosis not present

## 2015-12-01 DIAGNOSIS — Z79899 Other long term (current) drug therapy: Secondary | ICD-10-CM | POA: Insufficient documentation

## 2015-12-01 DIAGNOSIS — N521 Erectile dysfunction due to diseases classified elsewhere: Secondary | ICD-10-CM | POA: Diagnosis not present

## 2015-12-01 DIAGNOSIS — E162 Hypoglycemia, unspecified: Secondary | ICD-10-CM

## 2015-12-01 DIAGNOSIS — Z794 Long term (current) use of insulin: Principal | ICD-10-CM

## 2015-12-01 DIAGNOSIS — E11319 Type 2 diabetes mellitus with unspecified diabetic retinopathy without macular edema: Secondary | ICD-10-CM | POA: Insufficient documentation

## 2015-12-01 DIAGNOSIS — E119 Type 2 diabetes mellitus without complications: Secondary | ICD-10-CM

## 2015-12-01 LAB — GLUCOSE, POCT (MANUAL RESULT ENTRY)
POC GLUCOSE: 57 mg/dL — AB (ref 70–99)
POC GLUCOSE: 30 mg/dL — AB (ref 70–99)
POC Glucose: 97 mg/dl (ref 70–99)

## 2015-12-01 LAB — POCT GLYCOSYLATED HEMOGLOBIN (HGB A1C): HEMOGLOBIN A1C: 7.1

## 2015-12-01 MED ORDER — INSULIN GLARGINE 100 UNIT/ML SOLOSTAR PEN: 31.0000 [IU] | PEN_INJECTOR | Freq: Every day | SUBCUTANEOUS | Status: DC

## 2015-12-01 MED ORDER — INSULIN ASPART 100 UNIT/ML FLEXPEN: PEN_INJECTOR | SUBCUTANEOUS | Status: DC

## 2015-12-01 MED ORDER — GLUCOSE 40 % PO GEL
1.0000 | Freq: Once | ORAL | Status: AC
  Administered 2015-12-01: 37.5 g via ORAL

## 2015-12-01 NOTE — Progress Notes (Signed)
Patient here for follow up on his diabetes Patient has brought his current glucose reading Blood sugar in office is 57 Soda and crackers given to patient Patient had been discharged and came back because he was not feeling well And asked for another can of soda  Patient blood sugar had dropped to 30  insta glucose given  And can soda  Patient monitored throughout Patient stated he is feeling better

## 2015-12-01 NOTE — Progress Notes (Signed)
Patient ID: Antonio Martin, male   DOB: [birth year], 25 y.o.   MRN: [id number] SUBJECTIVE: 25 y.o. male for follow up of diabetes. Patient reports that he checks his sugars 2-3 times per day. His log reveals sugars of 48-302.  Most low numbers are upon awakening and at bedtime. He states that hypoglycemia has been a long time issue of his. He was previously managed by a diabetic specialist but states that he lost insurance and was discharged. Patient reports blurred vision, neuropathy of BLE, and erectile dysfunction.  Patient was seen by Ophthalmologist last week. He states that he has been diagnosed with Glaucoma and diabetic retinopathy. He states that he has had a surgery to correct it but now gets monthly eye injections. He states that his Ara Kussmaul is very poor in his right eye Now having difficulty with erections for the past 3 months. Reports that he is losing his morning erections. Current Outpatient Prescriptions  Medication Sig Dispense Refill  . insulin aspart (NOVOLOG FLEXPEN) 100 UNIT/ML FlexPen Take 10 units before each meal, but if blood sugar >200 take 14 units 15 mL 3  . Insulin Glargine (LANTUS SOLOSTAR) 100 UNIT/ML Solostar Pen Inject 31 Units into the skin daily at 10 pm. 5 pen 2  . Blood Glucose Monitoring Suppl (TRUE METRIX METER) W/DEVICE KIT Use as directed 1 kit 0  . glucose blood (TRUE METRIX BLOOD GLUCOSE TEST) test strip Use as instructed 100 each 12  . Insulin Syringes, Disposable, U-100 0.3 ML MISC 1 Syringe by Does not apply route 3 (three) times daily with meals. 90 each 0  . lisinopril (ZESTRIL) 2.5 MG tablet Take 1 tablet (2.5 mg total) by mouth daily. (Patient not taking: Reported on 10/02/2015) 30 tablet 3  . TRUEPLUS LANCETS 28G MISC Use a directed 100 each 12   No current facility-administered medications for this visit.  Review of Systems: Other than what is stated in HPI, all other systems are negative.   OBJECTIVE: Appearance: alert, well appearing, and in no  distress, oriented to person, place, and time and normal appearing weight. BP 144/85 mmHg  Pulse 100  Temp(Src) 98 F (36.7 C)  Resp 17  Ht _0  (1.626 m)  Wt 134 lb (60.782 kg)  BMI 22.99 kg/m2  SpO2 100%  Exam: heart sounds normal rate, regular rhythm, normal S1, S2, no murmurs, rubs, clicks or gallops, no JVD, chest clear, no hepatosplenomegaly, no carotid bruits, feet: warm, good capillary refill, no trophic changes or ulcerative lesions, normal DP and PT pulses, normal monofilament exam and normal sensory exam  ASSESSMENT: Lenus was seen today for follow-up.  Diagnoses and all orders for this visit:  Hypoglycemia -     dextrose (GLUTOSE) 40 % oral gel 37.5 g; Take 37.5 g by mouth once in office Patient was given crackers, soda, and glucose in office.   Type 1 diabetes mellitus without complication (HCC) -     Glucose (CBG) -     HgB A1c -     Basic Metabolic Panel -     Glucose (CBG) -     Glucose (CBG) Patient's A1C is 7.1 but he has big gaps in his sugars. Explained that this will affect his microvascular system. I highly recommend patient is managed by a endocrinologist. I will place referral once he is approved for Medicaid  Erectile dysfunction due to diseases classified elsewhere -     Testosterone I will check his testosterone. I have explained that diabetes can affect his erections.  I will likely refer to Urology if insurance is approved.   Return in about 3 months (around 02/28/2016) for Diabetes Mellitus.   Lance Bosch, NP 12/01/2015 4:34 PM

## 2015-12-02 ENCOUNTER — Other Ambulatory Visit: Payer: Self-pay | Admitting: Internal Medicine

## 2015-12-02 ENCOUNTER — Telehealth: Payer: Self-pay

## 2015-12-02 DIAGNOSIS — E875 Hyperkalemia: Secondary | ICD-10-CM

## 2015-12-02 DIAGNOSIS — R799 Abnormal finding of blood chemistry, unspecified: Secondary | ICD-10-CM

## 2015-12-02 LAB — BASIC METABOLIC PANEL
BUN: 11 mg/dL (ref 7–25)
CHLORIDE: 106 mmol/L (ref 98–110)
CO2: 29 mmol/L (ref 20–31)
Calcium: 9.5 mg/dL (ref 8.6–10.3)
Creat: 0.74 mg/dL (ref 0.60–1.35)
Glucose, Bld: 39 mg/dL — CL (ref 65–99)
POTASSIUM: 5.6 mmol/L — AB (ref 3.5–5.3)
SODIUM: 143 mmol/L (ref 135–146)

## 2015-12-02 LAB — TESTOSTERONE: Testosterone: 718 ng/dL (ref 250–827)

## 2015-12-02 MED ORDER — SODIUM POLYSTYRENE SULFONATE 15 GM/60ML PO SUSP: 30.0000 g | Freq: Once | ORAL | Status: DC

## 2015-12-02 NOTE — Telephone Encounter (Signed)
Spoke with patient and he is aware his potassium is elevated And to pick up his prescription for kayexalate Patient verbalized understanding to come back in one week for blood work

## 2015-12-02 NOTE — Telephone Encounter (Signed)
-----   Message from Ambrose Finland, NP sent at 12/02/2015 12:50 PM EST ----- Potassium elevated. I will send Kayexalate to pharmacy. Please explain this will make him have a diarrhea but it is to remove potassium. Please stress the importance of lowering potassium level. I want to repeat his potassium in 1 week--please schedule lab visit and place future order. Testosterone is normal

## 2015-12-12 ENCOUNTER — Telehealth: Payer: Self-pay

## 2015-12-12 NOTE — Telephone Encounter (Signed)
Returned patient phone call And confirmed his appt. With stacey on 2/28 For his diabetes follow up

## 2015-12-23 ENCOUNTER — Encounter: Payer: Self-pay | Admitting: Pharmacist

## 2015-12-23 ENCOUNTER — Ambulatory Visit: Payer: Medicaid Other | Attending: Internal Medicine | Admitting: Pharmacist

## 2015-12-23 DIAGNOSIS — Z79899 Other long term (current) drug therapy: Secondary | ICD-10-CM | POA: Insufficient documentation

## 2015-12-23 DIAGNOSIS — E11649 Type 2 diabetes mellitus with hypoglycemia without coma: Secondary | ICD-10-CM | POA: Diagnosis present

## 2015-12-23 DIAGNOSIS — R799 Abnormal finding of blood chemistry, unspecified: Secondary | ICD-10-CM

## 2015-12-23 DIAGNOSIS — E109 Type 1 diabetes mellitus without complications: Secondary | ICD-10-CM | POA: Diagnosis not present

## 2015-12-23 LAB — BASIC METABOLIC PANEL
BUN: 18 mg/dL (ref 7–25)
CHLORIDE: 105 mmol/L (ref 98–110)
CO2: 26 mmol/L (ref 20–31)
Calcium: 9.1 mg/dL (ref 8.6–10.3)
Creat: 0.87 mg/dL (ref 0.60–1.35)
Glucose, Bld: 97 mg/dL (ref 65–99)
Potassium: 4.6 mmol/L (ref 3.5–5.3)
SODIUM: 138 mmol/L (ref 135–146)

## 2015-12-23 LAB — GLUCOSE, POCT (MANUAL RESULT ENTRY): POC GLUCOSE: 134 mg/dL — AB (ref 70–99)

## 2015-12-23 MED FILL — !LANTUS SOLOSTAR 100UNITS/M: 100 | 29 days supply | Qty: 9 | Fill #2

## 2015-12-23 MED FILL — !NOVOLOG FLEXPEN SYRINGE 1: 100/ML | 40 days supply | Qty: 15 | Fill #3

## 2015-12-23 NOTE — Patient Instructions (Signed)
Thanks for coming to see me!  Follow up with Holland Commons in 3 months  Come back and see [place] sooner if you need something  Hypoglycemia Low blood sugar (hypoglycemia) means that the level of sugar in your blood is lower than it should be. Signs of low blood sugar include:  Getting sweaty.  Feeling hungry.  Feeling dizzy or weak.  Feeling sleepier than normal.  Feeling nervous.  Headaches.  Having a fast heartbeat. Low blood sugar can happen fast and can be an emergency. Your doctor can do tests to check your blood sugar level. You can have low blood sugar and not have diabetes. HOME CARE  Check your blood sugar as told by your doctor. If it is less than 70 mg/dl or as told by your doctor, take 1 of the following:  3 to 4 glucose tablets.   cup clear juice.   cup soda pop, not diet.  1 cup milk.  5 to 6 hard candies.  Recheck blood sugar after 15 minutes. Repeat until it is at the right level.  Eat a snack if it is more than 1 hour until the next meal.  Only take medicine as told by your doctor.  Do not skip meals. Eat on time.  Do not drink alcohol except with meals.  Check your blood glucose before driving.  Check your blood glucose before and after exercise.  Always carry treatment with you, such as glucose pills.  Always wear a medical alert bracelet if you have diabetes. GET HELP RIGHT AWAY IF:   Your blood glucose goes below 70 mg/dl or as told by your doctor, and you:  Are confused.  Are not able to swallow.  Pass out (faint).  You cannot treat yourself. You may need someone to help you.  You have low blood sugar problems often.  You have problems from your medicines.  You are not feeling better after 3 to 4 days.  You have vision changes. MAKE SURE YOU:   Understand these instructions.  Will watch this condition.  Will get help right away if you are not doing well or get worse.   This information is not intended to replace  advice given to you by your health care provider. Make sure you discuss any questions you have with your health care provider.   Document Released: 01/05/2010 Document Revised: 11/01/2014 Document Reviewed: 06/17/2015 Elsevier Interactive Patient Education [employer]

## 2015-12-23 NOTE — Progress Notes (Signed)
S:    Patient arrives in good spirits.  Presents for diabetes follow up.  Patient reports adherence with medications. Current diabetes medications include Lantus 31 units at night and Novolog 12-14 units TID with meals (despite me telling him to decrease his dose).   Patient reports hypoglycemic events. He reports that he has had to call the EMS in the past due to hypoglycemia.   Patient reported dietary habits: eats what is available to him  Patient reported exercise habits: walks   Patient reports nocturia.  Patient denies neuropathy. Patient denies visual changes. Patient reports self foot exams.    Patient never received diabetic bracelet though we mailed off for a free bracelet.   Patient also concerned about his erectile dysfunction and would like to take something for it. He understands that it is related to his diabetes but he is frustrated because he is 25 years old and is "too young" to have erectile dysfunction.   O:  Lab Results  Component Value Date   HGBA1C 7.10 12/01/2015   Home reported CBGs 50s - 200s  POCT glucose = 134  A/P: Diabetes currently uncontrolled based on home CBGs.   Patient reports hypoglycemic events and is able to verbalize appropriate hypoglycemia management plan. Patient reports adherence with medication. Control is suboptimal due to inadequate insulin dosing and monitoring.  Continued basal insulin Lantus (insulin glargine) 31 units daily. Continued rapid insulin Novolog (insulin aspart) at 10 units TID with meals. Stressed to him not to take 14 units of Novolog as I think that this is contributing significantly to his hypoglycemia. Patient is noncompliant with medication recommendations from myself and Holland Commons, NP despite [place] telling him the risks of volatile CBGs and hypoglycemia.   Patient is Medicaid pending, I think he needs an endocrinology referral once he has Medicaid. However, will have him meet with financial counseling to try to  get the [facility] in the meantime to get him in to see endocrinology as soon as possible.   Patient to follow up with Holland Commons for erectile dysfunction. Patient re-educated on volatile CBGs and impact on erection.   Next A1C anticipated May 2017.    Written patient instructions provided.  Total time in face to face counseling 20 minutes.  Follow up with Holland Commons, NP. I don't see the patient having any benefit from continuing to see me unless he will take our medical advice.

## 2015-12-29 ENCOUNTER — Telehealth: Payer: Self-pay

## 2015-12-29 NOTE — Telephone Encounter (Signed)
Called patient this pm  Patient not available Message left on voice mail to return our call 

## 2015-12-29 NOTE — Telephone Encounter (Signed)
-----   Message from Ambrose FinlandValerie A Keck, NP sent at 12/29/2015 12:46 PM EST ----- Labs normal

## 2016-01-14 ENCOUNTER — Encounter: Payer: Self-pay | Admitting: Internal Medicine

## 2016-01-14 ENCOUNTER — Other Ambulatory Visit: Payer: Self-pay | Admitting: Pharmacist

## 2016-01-14 ENCOUNTER — Ambulatory Visit: Payer: Medicaid Other | Attending: Internal Medicine | Admitting: Internal Medicine

## 2016-01-14 VITALS — BP 145/89 | HR 97 | Temp 98.0°F | Resp 16 | Ht 64.0 in | Wt 135.8 lb

## 2016-01-14 DIAGNOSIS — R109 Unspecified abdominal pain: Secondary | ICD-10-CM | POA: Diagnosis not present

## 2016-01-14 DIAGNOSIS — K30 Functional dyspepsia: Secondary | ICD-10-CM

## 2016-01-14 DIAGNOSIS — E109 Type 1 diabetes mellitus without complications: Secondary | ICD-10-CM

## 2016-01-14 DIAGNOSIS — R1013 Epigastric pain: Secondary | ICD-10-CM | POA: Diagnosis not present

## 2016-01-14 LAB — POCT URINALYSIS DIPSTICK
Bilirubin, UA: NEGATIVE
Glucose, UA: 500
KETONES UA: NEGATIVE
LEUKOCYTES UA: NEGATIVE
Nitrite, UA: NEGATIVE
PH UA: 5.5
PROTEIN UA: 300
SPEC GRAV UA: 1.025
UROBILINOGEN UA: 0.2

## 2016-01-14 MED ORDER — OMEPRAZOLE 20 MG PO CPDR: 20.0000 mg | DELAYED_RELEASE_CAPSULE | Freq: Every day | ORAL | Status: DC

## 2016-01-14 MED ORDER — ACCU-CHEK AVIVA PLUS W/DEVICE KIT: PACK | Status: DC

## 2016-01-14 MED ORDER — ACCU-CHEK SOFT TOUCH LANCETS MISC: Status: DC

## 2016-01-14 MED ORDER — GLUCOSE BLOOD VI STRP: ORAL_STRIP | Status: DC

## 2016-01-14 MED FILL — OMEPRAZOLE DR 20 MG CAPSULE: 20 | 30 days supply | Qty: 30 | Fill #0

## 2016-01-14 MED FILL — ACCU-CHEK AVIVA PLUS TEST S: 25 days supply | Qty: 100 | Fill #0

## 2016-01-14 NOTE — Progress Notes (Signed)
   Subjective:    Patient ID: Antonio Martin, male    DOB: 11/24/1990, 25 y.o.   MRN: [id number]  Abdominal Pain This is a new problem. The current episode started in the past 7 days. The problem has been unchanged. The pain is located in the epigastric region. The pain is mild. The quality of the pain is burning and a sensation of fullness. Associated symptoms include belching and flatus. The pain is aggravated by eating. He has tried nothing for the symptoms. There is no history of GERD.     Review of Systems  Gastrointestinal: Positive for abdominal pain and flatus.  All other systems reviewed and are negative.      Objective:   Physical Exam  Constitutional: He is oriented to person, place, and time.  Cardiovascular: Normal rate, regular rhythm and normal heart sounds.   Pulmonary/Chest: Effort normal and breath sounds normal.  Abdominal: Soft. Bowel sounds are normal. He exhibits no distension. There is tenderness (epigastric pain). There is no rebound and no guarding.  Neurological: He is alert and oriented to person, place, and time.  Skin: Skin is warm and dry.       Assessment & Plan:  Antonio Martin was seen today for flank pain.  Diagnoses and all orders for this visit:  Abdominal pain, unspecified abdominal location -     Urinalysis Dipstick  Acid indigestion -     Begin omeprazole (PRILOSEC) 20 MG capsule; Take 1 capsule (20 mg total) by mouth daily. Discussed diet and weight with patient relating to acid reflux.  Went over things that may exacerbate acid reflux such as tomatoes, spicy foods, coffee, carbonated beverages, chocolates, etc.  Advised patient to avoid laying down at least two hours after meals and sleep with HOB elevated.    Return in about 2 months (around 03/15/2016) for Diabetes Mellitus.   Antonio FinlandValerie A Dorissa Stinnette, NP 01/14/2016 10:27 AM

## 2016-01-14 NOTE — Progress Notes (Signed)
Patient complains of having some kidney pain and left Sided abd pain Denies any fever or diarrhea

## 2016-01-15 MED FILL — LANTUS SOLOSTAR 100 UNITS/M: 100 | 29 days supply | Qty: 9 | Fill #3

## 2016-05-30 ENCOUNTER — Emergency Department (HOSPITAL_COMMUNITY)
Admission: EM | Admit: 2016-05-30 | Discharge: 2016-05-30 | Disposition: A | Discharge status: Home / Self Care | Payer: Medicaid Other | Attending: Emergency Medicine | Admitting: Emergency Medicine

## 2016-05-30 ENCOUNTER — Encounter (HOSPITAL_COMMUNITY): Payer: Self-pay | Admitting: *Deleted

## 2016-05-30 DIAGNOSIS — E1165 Type 2 diabetes mellitus with hyperglycemia: Secondary | ICD-10-CM | POA: Diagnosis present

## 2016-05-30 DIAGNOSIS — Z794 Long term (current) use of insulin: Secondary | ICD-10-CM | POA: Diagnosis not present

## 2016-05-30 DIAGNOSIS — R739 Hyperglycemia, unspecified: Secondary | ICD-10-CM

## 2016-05-30 DIAGNOSIS — R112 Nausea with vomiting, unspecified: Secondary | ICD-10-CM | POA: Insufficient documentation

## 2016-05-30 LAB — COMPREHENSIVE METABOLIC PANEL
ALBUMIN: 3.5 g/dL (ref 3.5–5.0)
ALK PHOS: 81 U/L (ref 38–126)
ALT: 16 U/L — AB (ref 17–63)
AST: 20 U/L (ref 15–41)
Anion gap: 8 (ref 5–15)
BUN: 13 mg/dL (ref 6–20)
CALCIUM: 8.4 mg/dL — AB (ref 8.9–10.3)
CO2: 25 mmol/L (ref 22–32)
CREATININE: 1.07 mg/dL (ref 0.61–1.24)
Chloride: 99 mmol/L — ABNORMAL LOW (ref 101–111)
GFR calc Af Amer: >60 mL/min (ref >60)
GFR calc non Af Amer: >60 mL/min (ref >60)
GLUCOSE: 725 mg/dL — AB (ref 65–99)
Potassium: 4.1 mmol/L (ref 3.5–5.1)
SODIUM: 132 mmol/L — AB (ref 135–145)
Total Bilirubin: 0.9 mg/dL (ref 0.3–1.2)
Total Protein: 6.2 g/dL — ABNORMAL LOW (ref 6.5–8.1)

## 2016-05-30 LAB — CBC WITH DIFFERENTIAL/PLATELET
BASOS ABS: 0.1 10*3/uL (ref 0.0–0.1)
BASOS PCT: 1 %
EOS ABS: 0.7 10*3/uL (ref 0.0–0.7)
Eosinophils Relative: 10 %
HCT: 38.1 % — ABNORMAL LOW (ref 39.0–52.0)
HEMOGLOBIN: 13 g/dL (ref 13.0–17.0)
Lymphocytes Relative: 18 %
Lymphs Abs: 1.2 10*3/uL (ref 0.7–4.0)
MCH: 30.3 pg (ref 26.0–34.0)
MCHC: 34.1 g/dL (ref 30.0–36.0)
MCV: 88.8 fL (ref 78.0–100.0)
Monocytes Absolute: 0.5 10*3/uL (ref 0.1–1.0)
Monocytes Relative: 7 %
NEUTROS PCT: 64 %
Neutro Abs: 4.3 10*3/uL (ref 1.7–7.7)
Platelets: 273 10*3/uL (ref 150–400)
RBC: 4.29 MIL/uL (ref 4.22–5.81)
RDW: 12.2 % (ref 11.5–15.5)
WBC: 6.8 10*3/uL (ref 4.0–10.5)

## 2016-05-30 LAB — I-STAT CG4 LACTIC ACID, ED: Lactic Acid, Venous: 1.82 mmol/L (ref 0.5–1.9)

## 2016-05-30 LAB — URINE MICROSCOPIC-ADD ON

## 2016-05-30 LAB — BLOOD GAS, VENOUS
ACID-BASE DEFICIT: 0.6 mmol/L (ref 0.0–2.0)
Bicarbonate: 22.9 mEq/L (ref 20.0–24.0)
O2 SAT: 94.4 %
PATIENT TEMPERATURE: 37
PCO2 VEN: 35.6 mmHg — AB (ref 45.0–50.0)
PH VEN: 7.424 — AB (ref 7.250–7.300)
TCO2: 20.2 mmol/L (ref 0–100)
pO2, Ven: 69.8 mmHg — ABNORMAL HIGH (ref 31.0–45.0)

## 2016-05-30 LAB — LIPASE, BLOOD: LIPASE: 28 U/L (ref 11–51)

## 2016-05-30 LAB — URINALYSIS, ROUTINE W REFLEX MICROSCOPIC
Bilirubin Urine: NEGATIVE
HGB URINE DIPSTICK: NEGATIVE
Ketones, ur: NEGATIVE mg/dL
Leukocytes, UA: NEGATIVE
Nitrite: NEGATIVE
PROTEIN: 100 mg/dL — AB
Specific Gravity, Urine: 1.023 (ref 1.005–1.030)
pH: 6 (ref 5.0–8.0)

## 2016-05-30 LAB — CBG MONITORING, ED
GLUCOSE-CAPILLARY: 209 mg/dL — AB (ref 65–99)
Glucose-Capillary: >600 mg/dL (ref 65–99)

## 2016-05-30 MED ORDER — INSULIN ASPART 100 UNIT/ML ~~LOC~~ SOLN
12.0000 [IU] | Freq: Once | SUBCUTANEOUS | Status: AC
  Administered 2016-05-30: 12 [IU] via INTRAVENOUS
  Filled 2016-05-30: qty 1

## 2016-05-30 MED ORDER — ONDANSETRON 4 MG PO TBDP: 4.0000 mg | ORAL_TABLET | Freq: Three times a day (TID) | ORAL | 0 refills | Status: DC | PRN

## 2016-05-30 MED ORDER — SODIUM CHLORIDE 0.9 % IV BOLUS (SEPSIS)
1000.0000 mL | Freq: Once | INTRAVENOUS | Status: AC
  Administered 2016-05-30: 1000 mL via INTRAVENOUS

## 2016-05-30 MED ORDER — ONDANSETRON HCL 4 MG/2ML IJ SOLN
4.0000 mg | Freq: Once | INTRAMUSCULAR | Status: AC
  Administered 2016-05-30: 4 mg via INTRAVENOUS
  Filled 2016-05-30: qty 2

## 2016-05-30 MED ORDER — SODIUM CHLORIDE 0.9 % IV BOLUS (SEPSIS): 1000.0000 mL | Freq: Once | INTRAVENOUS | Status: DC

## 2016-05-30 NOTE — ED Notes (Signed)
Pt refusing to allow this RN to start another liter of NS until CBG is done.

## 2016-05-30 NOTE — ED Provider Notes (Signed)
[facility]-EMERGENCY DEPT Provider Note   CSN: [id number] Arrival date & time: 05/30/16  1500  First Provider Contact:  None       History   Chief Complaint Chief Complaint  Patient presents with  . Hyperglycemia    HPI Antonio Martin is a 25 y.o. male.  HPI   Yesterday started not feeling well, measured sugar was 456, takes novalog, increased dose, checked it again and still high. Every time trying to eat throwing up. Threw up twice last night but had frequent dry heaves. Now sugar 500s. Denies missing doses insulin. Taking lantus at bedtime, novalog 3 times a day. Does report yesterday in the morning his glucose was low at 90, and he had 2 sugary drinks of [place] iced teas, and after that it became elevated and he could not get it back under control.  Denies fevers, no chills, no cough.    Past Medical History:  Diagnosis Date  . Diabetes mellitus without complication [facility](HCC)     Patient Active Problem List   Diagnosis Date Noted  . Hyperglycemia 07/30/2015  . Type 1 diabetes mellitus (HCC) 07/30/2015    History reviewed. No pertinent surgical history.     Home Medications    Prior to Admission medications   Medication Sig Start Date End Date Taking? Authorizing Provider  insulin aspart (NOVOLOG FLEXPEN) 100 UNIT/ML FlexPen Inject 12 Units into the skin 3 (three) times daily before meals.   Yes Historical Provider, MD  insulin glargine (LANTUS) 100 unit/mL SOPN Inject 30 Units into the skin at bedtime.   Yes Historical Provider, MD  ondansetron (ZOFRAN ODT) 4 MG disintegrating tablet Take 1 tablet (4 mg total) by mouth every 8 (eight) hours as needed for nausea or vomiting. 05/30/16   Alvira MondayErin Ashlen Kiger, MD    Family History History reviewed. No pertinent family history.  Social History Social History  Substance Use Topics  . Smoking status: Never Smoker  . Smokeless tobacco: Never Used  . Alcohol use No     Allergies   Review of patient's allergies indicates  no known allergies.   Review of Systems Review of Systems  Constitutional: Negative for fever.  HENT: Negative for sore throat.   Eyes: Negative for visual disturbance.  Respiratory: Negative for shortness of breath.   Cardiovascular: Negative for chest pain.  Gastrointestinal: Positive for nausea and vomiting. Negative for abdominal pain and diarrhea.  Endocrine: Positive for polyuria.  Genitourinary: Positive for frequency. Negative for difficulty urinating.  Musculoskeletal: Negative for back pain and neck stiffness.  Skin: Negative for rash.  Neurological: Negative for syncope and headaches.     Physical Exam Updated Vital Signs BP 140/91 (BP Location: Right Arm)   Pulse 90   Temp 98 F (36.7 C) (Oral)   Resp 16   Ht 5\' 3"  (1.6 m)   Wt 126 lb (57.2 kg)   SpO2 100%   BMI 22.32 kg/m   Physical Exam  Constitutional: He is oriented to person, place, and time. He appears well-developed and well-nourished. No distress.  HENT:  Head: Normocephalic and atraumatic.  Eyes: Conjunctivae and EOM are normal.  Neck: Normal range of motion.  Cardiovascular: Normal rate, regular rhythm, normal heart sounds and intact distal pulses.  Exam reveals no gallop and no friction rub.   No murmur heard. Pulmonary/Chest: Effort normal and breath sounds normal. No respiratory distress. He has no wheezes. He has no rales.  Abdominal: Soft. He exhibits no distension. There is no tenderness. There is no  guarding.  Musculoskeletal: He exhibits no edema.  Neurological: He is alert and oriented to person, place, and time.  Skin: Skin is warm and dry. He is not diaphoretic.  Nursing note and vitals reviewed.    ED Treatments / Results  Labs (all labs ordered are listed, but only abnormal results are displayed) Labs Reviewed  CBC WITH DIFFERENTIAL/PLATELET - Abnormal; Notable for the following:       Result Value   HCT 38.1 (*)    All other components within normal limits  COMPREHENSIVE  METABOLIC PANEL - Abnormal; Notable for the following:    Sodium 132 (*)    Chloride 99 (*)    Glucose, Bld 725 (*)    Calcium 8.4 (*)    Total Protein 6.2 (*)    ALT 16 (*)    All other components within normal limits  URINALYSIS, ROUTINE W REFLEX MICROSCOPIC (NOT AT [facility]) - Abnormal; Notable for the following:    Glucose, UA >1000 (*)    Protein, ur 100 (*)    All other components within normal limits  URINE MICROSCOPIC-ADD ON - Abnormal; Notable for the following:    Squamous Epithelial / LPF 0-5 (*)    Bacteria, UA RARE (*)    Casts GRANULAR CAST (*)    All other components within normal limits  BLOOD GAS, VENOUS - Abnormal; Notable for the following:    pH, Ven 7.424 (*)    pCO2, Ven 35.6 (*)    pO2, Ven 69.8 (*)    All other components within normal limits  CBG MONITORING, ED - Abnormal; Notable for the following:    Glucose-Capillary >600 (*)    All other components within normal limits  CBG MONITORING, ED - Abnormal; Notable for the following:    Glucose-Capillary 209 (*)    All other components within normal limits  LIPASE, BLOOD  I-STAT VENOUS BLOOD GAS, ED  I-STAT CG4 LACTIC ACID, ED    EKG  EKG Interpretation None       Radiology No results found.  Procedures Procedures (including critical care time)  Medications Ordered in ED Medications  sodium chloride 0.9 % bolus 1,000 mL (1,000 mLs Intravenous Not Given 05/30/16 1928)  sodium chloride 0.9 % bolus 1,000 mL (0 mLs Intravenous Stopped 05/30/16 1901)  ondansetron (ZOFRAN) injection 4 mg (4 mg Intravenous Given 05/30/16 1650)  insulin aspart (novoLOG) injection 12 Units (12 Units Intravenous Given 05/30/16 1754)     Initial Impression / Assessment and Plan / ED Course  I have reviewed the triage vital signs and the nursing notes.  Pertinent labs & imaging results that were available during my care of the patient were reviewed by me and considered in my medical decision making (see chart for  details).  Clinical Course   25 year old male with a history of type 1 diabetes presents with concern for hyperglycemia, nausea and vomiting. Unclear trigger for patient's hyperglycemia, he denies nonadherence to his insulin regimen, and denies recent illness. Labs showed bicarbonate of 25 , no anion gap, urinalysis shows no ketones, and blood gas shows a pH of 7.4. Patient given IV fluids, nausea medicines with improvement.  Glucose 725 without signs of DKA.  Patient was given 2 L normal saline, 12 units of insulin IV with improvement of glucose down to 200s. Patient reports feeling improvement. Given a prescription for Zofran, and recommend close follow-up with PCP. Patient discharged in stable condition with understanding of reasons to return.   Final Clinical Impressions(s) /  ED Diagnoses   Final diagnoses:  Hyperglycemia    New Prescriptions Discharge Medication List as of 05/30/2016  7:22 PM    START taking these medications   Details  ondansetron (ZOFRAN ODT) 4 MG disintegrating tablet Take 1 tablet (4 mg total) by mouth every 8 (eight) hours as needed for nausea or vomiting., Starting Sun 05/30/2016, Print         Alvira Monday, MD 05/30/16 [phone]

## 2016-05-30 NOTE — ED Notes (Signed)
Pt is requesting beverage from staff however it has been explained multiple times that he is NPO at this time. Pt requesting to have CBG done at this time and to speak with MD. Will make MD aware.

## 2016-05-30 NOTE — ED Notes (Signed)
Attempted to start PIV x2 without success. Bloodwork was obtained however IV unable to be established. Charge RN to attempt to start PIV

## 2016-05-30 NOTE — ED Notes (Signed)
After taking pt CBG, pt stated he is ready to go

## 2016-05-30 NOTE — ED Notes (Signed)
Bed: WA01 Expected date:  Expected time:  Means of arrival:  Comments: 25 yo  Hyperglycemia; N/V

## 2016-05-30 NOTE — ED Triage Notes (Signed)
Pt with history of diabetes and his glucose was high. Pt reports nausea,

## 2016-09-09 ENCOUNTER — Encounter (HOSPITAL_COMMUNITY): Payer: Self-pay | Admitting: Emergency Medicine

## 2016-09-09 ENCOUNTER — Emergency Department (HOSPITAL_COMMUNITY)
Admission: EM | Admit: 2016-09-09 | Discharge: 2016-09-09 | Disposition: A | Discharge status: Home / Self Care | Payer: Medicaid Other | Attending: Emergency Medicine | Admitting: Emergency Medicine

## 2016-09-09 DIAGNOSIS — R1013 Epigastric pain: Secondary | ICD-10-CM | POA: Diagnosis not present

## 2016-09-09 DIAGNOSIS — E1065 Type 1 diabetes mellitus with hyperglycemia: Secondary | ICD-10-CM | POA: Insufficient documentation

## 2016-09-09 DIAGNOSIS — R112 Nausea with vomiting, unspecified: Secondary | ICD-10-CM | POA: Diagnosis not present

## 2016-09-09 DIAGNOSIS — R739 Hyperglycemia, unspecified: Secondary | ICD-10-CM

## 2016-09-09 LAB — CBC WITH DIFFERENTIAL/PLATELET
Basophils Absolute: 0.1 K/uL (ref 0.0–0.1)
Basophils Relative: 1 %
Eosinophils Absolute: 0.7 K/uL (ref 0.0–0.7)
Eosinophils Relative: 10 %
HCT: 38.6 % — ABNORMAL LOW (ref 39.0–52.0)
Hemoglobin: 13.5 g/dL (ref 13.0–17.0)
Lymphocytes Relative: 23 %
Lymphs Abs: 1.6 K/uL (ref 0.7–4.0)
MCH: 30.5 pg (ref 26.0–34.0)
MCHC: 35 g/dL (ref 30.0–36.0)
MCV: 87.3 fL (ref 78.0–100.0)
Monocytes Absolute: 0.4 K/uL (ref 0.1–1.0)
Monocytes Relative: 5 %
Neutro Abs: 4.3 K/uL (ref 1.7–7.7)
Neutrophils Relative %: 61 %
Platelets: 262 K/uL (ref 150–400)
RBC: 4.42 MIL/uL (ref 4.22–5.81)
RDW: 11.8 % (ref 11.5–15.5)
WBC: 7.1 K/uL (ref 4.0–10.5)

## 2016-09-09 LAB — I-STAT VENOUS BLOOD GAS, ED
Acid-base deficit: 4 mmol/L — ABNORMAL HIGH (ref 0.0–2.0)
Bicarbonate: 21.3 mmol/L (ref 20.0–28.0)
O2 Saturation: 35 %
TCO2: 23 mmol/L (ref 0–100)
pCO2, Ven: 39.3 mmHg — ABNORMAL LOW (ref 44.0–60.0)
pH, Ven: 7.343 (ref 7.250–7.430)
pO2, Ven: 22 mmHg — CL (ref 32.0–45.0)

## 2016-09-09 LAB — URINALYSIS, ROUTINE W REFLEX MICROSCOPIC
Bilirubin Urine: NEGATIVE
Glucose, UA: >1000 mg/dL — AB
Ketones, ur: NEGATIVE mg/dL
Leukocytes, UA: NEGATIVE
Nitrite: NEGATIVE
Protein, ur: 100 mg/dL — AB
Specific Gravity, Urine: 1.02 (ref 1.005–1.030)
pH: 6.5 (ref 5.0–8.0)

## 2016-09-09 LAB — COMPREHENSIVE METABOLIC PANEL WITH GFR
ALT: 15 U/L — ABNORMAL LOW (ref 17–63)
AST: 21 U/L (ref 15–41)
Albumin: 3 g/dL — ABNORMAL LOW (ref 3.5–5.0)
Alkaline Phosphatase: 67 U/L (ref 38–126)
Anion gap: 7 (ref 5–15)
BUN: 11 mg/dL (ref 6–20)
CO2: 23 mmol/L (ref 22–32)
Calcium: 8.1 mg/dL — ABNORMAL LOW (ref 8.9–10.3)
Chloride: 102 mmol/L (ref 101–111)
Creatinine, Ser: 0.92 mg/dL (ref 0.61–1.24)
GFR calc Af Amer: >60 mL/min
GFR calc non Af Amer: >60 mL/min
Glucose, Bld: 582 mg/dL (ref 65–99)
Potassium: 4.2 mmol/L (ref 3.5–5.1)
Sodium: 132 mmol/L — ABNORMAL LOW (ref 135–145)
Total Bilirubin: 0.6 mg/dL (ref 0.3–1.2)
Total Protein: 5.4 g/dL — ABNORMAL LOW (ref 6.5–8.1)

## 2016-09-09 LAB — URINE MICROSCOPIC-ADD ON: Bacteria, UA: NONE SEEN

## 2016-09-09 LAB — CBG MONITORING, ED
GLUCOSE-CAPILLARY: 561 mg/dL — AB (ref 65–99)
GLUCOSE-CAPILLARY: 180 mg/dL — AB (ref 65–99)
GLUCOSE-CAPILLARY: 40 mg/dL — AB (ref 65–99)
Glucose-Capillary: 238 mg/dL — ABNORMAL HIGH (ref 65–99)
Glucose-Capillary: 28 mg/dL — CL (ref 65–99)
Glucose-Capillary: 227 mg/dL — ABNORMAL HIGH (ref 65–99)

## 2016-09-09 LAB — LIPASE, BLOOD: Lipase: 35 U/L (ref 11–51)

## 2016-09-09 MED ORDER — ONDANSETRON HCL 4 MG/2ML IJ SOLN
4.0000 mg | Freq: Once | INTRAMUSCULAR | Status: AC
  Administered 2016-09-09: 4 mg via INTRAVENOUS
  Filled 2016-09-09: qty 2

## 2016-09-09 MED ORDER — FAMOTIDINE IN NACL 20-0.9 MG/50ML-% IV SOLN
20.0000 mg | Freq: Once | INTRAVENOUS | Status: AC
  Administered 2016-09-09: 20 mg via INTRAVENOUS
  Filled 2016-09-09: qty 50

## 2016-09-09 MED ORDER — SODIUM CHLORIDE 0.9 % IV BOLUS (SEPSIS)
1000.0000 mL | Freq: Once | INTRAVENOUS | Status: AC
  Administered 2016-09-09: 1000 mL via INTRAVENOUS

## 2016-09-09 MED ORDER — INSULIN ASPART 100 UNIT/ML ~~LOC~~ SOLN
10.0000 [IU] | Freq: Once | SUBCUTANEOUS | Status: AC
  Administered 2016-09-09: 10 [IU] via INTRAVENOUS
  Filled 2016-09-09: qty 1

## 2016-09-09 NOTE — ED Triage Notes (Signed)
Pt arrives via EMS from home with nausea vomiting that began last night. This AM vomited again. Home CBG was 458. No insulin today per patient because he was not feeling well and vomiting. EMs cbg 537.

## 2016-09-09 NOTE — Discharge Instructions (Signed)
Take home insulin as prescribed. Follow up with your primary care provider. Take blood sugar daily. Return to the ED if you experience severe worsening of your symptoms, vomiting, fever, abdominal pain, severely elevated or low blood sugar.

## 2016-09-09 NOTE — ED Provider Notes (Signed)
[facility]-EMERGENCY DEPT Provider Note   CSN: [id number] Arrival date & time: 09/09/16  1446     History   Chief Complaint Chief Complaint  Patient presents with  . Hyperglycemia  . Emesis    HPI Antonio Martin is a 25 y.o. male with a past medical history of type 1 diabetes who presents to the ED today complaining of nausea and vomiting. Patient states that he last took his insulin around 8:30 PM last night. A few hours later he began uncontrollably vomiting. He has continued vomiting today. Last episode of emesis was one hour ago prior to arrival area did emesis is nonbloody and nonbilious. He reports associated pain in his epigastrium that he states is from the vomiting. He said that his sugar at home was over 400. He denies any fevers, diarrhea, dysuria. He reports similar episodes like this in the past.  HPI  Past Medical History:  Diagnosis Date  . Diabetes mellitus without complication [facility](HCC)     Patient Active Problem List   Diagnosis Date Noted  . Hyperglycemia 07/30/2015  . Type 1 diabetes mellitus (HCC) 07/30/2015    History reviewed. No pertinent surgical history.     Home Medications    Prior to Admission medications   Medication Sig Start Date End Date Taking? Authorizing Provider  insulin aspart (NOVOLOG FLEXPEN) 100 UNIT/ML FlexPen Inject 12 Units into the skin 3 (three) times daily before meals.    Historical Provider, MD  insulin glargine (LANTUS) 100 unit/mL SOPN Inject 30 Units into the skin at bedtime.    Historical Provider, MD  ondansetron (ZOFRAN ODT) 4 MG disintegrating tablet Take 1 tablet (4 mg total) by mouth every 8 (eight) hours as needed for nausea or vomiting. 05/30/16   Alvira MondayErin Schlossman, MD    Family History History reviewed. No pertinent family history.  Social History Social History  Substance Use Topics  . Smoking status: Never Smoker  . Smokeless tobacco: Never Used  . Alcohol use No     Allergies   Patient has no known  allergies.   Review of Systems Review of Systems  All other systems reviewed and are negative.    Physical Exam Updated Vital Signs There were no vitals taken for this visit.  Physical Exam  Constitutional: He is oriented to person, place, and time. He appears well-developed and well-nourished. No distress.  HENT:  Head: Normocephalic and atraumatic.  Mouth/Throat: No oropharyngeal exudate.  Moist mucous membranes  Eyes: Conjunctivae and EOM are normal. Pupils are equal, round, and reactive to light. Right eye exhibits no discharge. Left eye exhibits no discharge. No scleral icterus.  Cardiovascular: Normal rate, regular rhythm, normal heart sounds and intact distal pulses.  Exam reveals no gallop and no friction rub.   No murmur heard. Pulmonary/Chest: Effort normal and breath sounds normal. No respiratory distress. He has no wheezes. He has no rales. He exhibits no tenderness.  Abdominal: Soft. He exhibits no distension. There is tenderness ( mild TTP over epigastrium). There is no guarding.  Musculoskeletal: Normal range of motion. He exhibits no edema.  Neurological: He is alert and oriented to person, place, and time.  Skin: Skin is warm and dry. No rash noted. He is not diaphoretic. No erythema. No pallor.  Psychiatric: He has a normal mood and affect. His behavior is normal.  Nursing note and vitals reviewed.    ED Treatments / Results  Labs (all labs ordered are listed, but only abnormal results are displayed) Labs Reviewed  CBC  WITH DIFFERENTIAL/PLATELET - Abnormal; Notable for the following:       Result Value   HCT 38.6 (*)    All other components within normal limits  URINALYSIS, ROUTINE W REFLEX MICROSCOPIC (NOT AT [facility]) - Abnormal; Notable for the following:    Glucose, UA >1000 (*)    Hgb urine dipstick TRACE (*)    Protein, ur 100 (*)    All other components within normal limits  URINE MICROSCOPIC-ADD ON - Abnormal; Notable for the following:    Squamous  Epithelial / LPF 0-5 (*)    All other components within normal limits  CBG MONITORING, ED - Abnormal; Notable for the following:    Glucose-Capillary 561 (*)    All other components within normal limits  I-STAT VENOUS BLOOD GAS, ED - Abnormal; Notable for the following:    pCO2, Ven 39.3 (*)    pO2, Ven 22.0 (*)    Acid-base deficit 4.0 (*)    All other components within normal limits  COMPREHENSIVE METABOLIC PANEL  LIPASE, BLOOD    EKG  EKG Interpretation None       Radiology No results found.  Procedures Procedures (including critical care time)  Medications Ordered in ED Medications  sodium chloride 0.9 % bolus 1,000 mL (1,000 mLs Intravenous New Bag/Given 09/09/16 1540)  ondansetron (ZOFRAN) injection 4 mg (4 mg Intravenous Given 09/09/16 1540)  famotidine (PEPCID) IVPB 20 mg premix (20 mg Intravenous New Bag/Given 09/09/16 1540)     Initial Impression / Assessment and Plan / ED Course  I have reviewed the triage vital signs and the nursing notes.  Pertinent labs & imaging results that were available during my care of the patient were reviewed by me and considered in my medical decision making (see chart for details).  Clinical Course     25 y.o M Type 1 diabetic presents to the ED today c/o N/V onset last night. On presentation to ED pt overall appears well. VSS. Moist mucous membranes. No active emesis. Blood glucose elevated at 582. No sign of CKA. No anion gap. No ketonuria. Pt last took his insulin last night. He was given 1L NS and 10 units of IV insulin. CBG repeated after 1 hour and blood sugar dropped to 238. Will repeat again in 30 minutes to ensure that pt does not become hypoglycemic. IV fluids and insulin discontinued.  CBG repeated in 30 minutes, now 27. Pt still active and alert, in no distress. He was given a sandwich, coke and orange juice to drink. Repeat CBG 15 minutes late, now 40.  Repeat CBG in additional 15 minutes, now 180. Pt remains in NAD  and now asymptomatic. Pt states that his blood sugar often fluctuates this dramatically.   Cbg again repeated without further PO intake or IV fluids. Now 227. Pt has continued to remain asymptomatic in ED. Feel that he can further manage his blood sugar at home with home insulin. Counseled pt on benefits of insulin pump for adequate blood sugar regulation. Follow up with endocrinologist/PCP. Return precautions outlined in patient discharge instructions.    Final Clinical Impressions(s) / ED Diagnoses   Final diagnoses:  Hyperglycemia  Non-intractable vomiting with nausea, unspecified vomiting type    New Prescriptions New Prescriptions   No medications on file     Dub MikesSamantha Tripp Dowless, PA-C 09/13/16 1249    Nira ConnPedro Eduardo Cardama, MD 09/13/16 1732

## 2016-09-13 LAB — CBG MONITORING, ED: Glucose-Capillary: 27 mg/dL — CL (ref 65–99)

## 2016-11-09 ENCOUNTER — Encounter (HOSPITAL_COMMUNITY): Payer: Self-pay | Admitting: Emergency Medicine

## 2016-11-09 ENCOUNTER — Emergency Department (HOSPITAL_COMMUNITY)
Admission: EM | Admit: 2016-11-09 | Discharge: 2016-11-09 | Disposition: A | Discharge status: Home / Self Care | Payer: Medicaid Other | Attending: Emergency Medicine | Admitting: Emergency Medicine

## 2016-11-09 DIAGNOSIS — E1065 Type 1 diabetes mellitus with hyperglycemia: Secondary | ICD-10-CM | POA: Diagnosis not present

## 2016-11-09 DIAGNOSIS — R739 Hyperglycemia, unspecified: Secondary | ICD-10-CM

## 2016-11-09 LAB — CBG MONITORING, ED
GLUCOSE-CAPILLARY: 509 mg/dL — AB (ref 65–99)
Glucose-Capillary: 475 mg/dL — ABNORMAL HIGH (ref 65–99)
Glucose-Capillary: 310 mg/dL — ABNORMAL HIGH (ref 65–99)

## 2016-11-09 LAB — BASIC METABOLIC PANEL
Anion gap: 10 (ref 5–15)
BUN: 14 mg/dL (ref 6–20)
CO2: 21 mmol/L — AB (ref 22–32)
CREATININE: 1.12 mg/dL (ref 0.61–1.24)
Calcium: 8.3 mg/dL — ABNORMAL LOW (ref 8.9–10.3)
Chloride: 100 mmol/L — ABNORMAL LOW (ref 101–111)
GFR calc non Af Amer: >60 mL/min (ref >60)
Glucose, Bld: 543 mg/dL (ref 65–99)
Potassium: 4.6 mmol/L (ref 3.5–5.1)
Sodium: 131 mmol/L — ABNORMAL LOW (ref 135–145)

## 2016-11-09 LAB — CBC
HCT: 38.8 % — ABNORMAL LOW (ref 39.0–52.0)
HEMOGLOBIN: 14.2 g/dL (ref 13.0–17.0)
MCH: 31.2 pg (ref 26.0–34.0)
MCHC: 36.6 g/dL — ABNORMAL HIGH (ref 30.0–36.0)
MCV: 85.3 fL (ref 78.0–100.0)
PLATELETS: 250 10*3/uL (ref 150–400)
RBC: 4.55 MIL/uL (ref 4.22–5.81)
RDW: 11.9 % (ref 11.5–15.5)
WBC: 9.6 10*3/uL (ref 4.0–10.5)

## 2016-11-09 MED ORDER — INSULIN ASPART 100 UNIT/ML ~~LOC~~ SOLN
12.0000 [IU] | Freq: Once | SUBCUTANEOUS | Status: AC
  Administered 2016-11-09: 12 [IU] via INTRAVENOUS
  Filled 2016-11-09: qty 1

## 2016-11-09 MED ORDER — SODIUM CHLORIDE 0.9 % IV BOLUS (SEPSIS)
1000.0000 mL | Freq: Once | INTRAVENOUS | Status: AC
  Administered 2016-11-09: 1000 mL via INTRAVENOUS

## 2016-11-09 NOTE — ED Notes (Signed)
Critical glucose given to Dr. Shyrl NumbersNanavanti 543

## 2016-11-09 NOTE — ED Notes (Signed)
Pt was having issues with Lantus Solostar pens. Called manufacturer and resolved the issues. All pens are now operable. Pt has been educated about pen operation by this tech and [employer] RPh.

## 2016-11-09 NOTE — ED Notes (Signed)
POCT CBG resulted 509; Marylene LandAngela, RN aware

## 2016-11-09 NOTE — ED Notes (Signed)
Patient on continuous pulse oximetry and blood pressure cuff; 2 warm blankets given

## 2016-11-09 NOTE — ED Triage Notes (Signed)
Pt from home via [facility] with c/o hyperglycemia starting this morning. Pt reports home CBG of 544, it was 420 with EMS.  Pt states he was to start a new insulin pin which worked last night but he tried every pin he that this morning and could not get any of them to work.  This RN attempted to educate pt that the new particular medications instructions read "at bedtime."  Pt stated he understood but did not repeat understanding correctly.  Given 100 mL NS, one episode of emesis last night.  Pt states he also takes other medications.  Pt's primary language is Tigrinian.  NAD, A&O.

## 2016-11-09 NOTE — ED Provider Notes (Signed)
[facility]-EMERGENCY DEPT Provider Note   CSN: [id number] Arrival date & time: 11/09/16  [id number]     History   Chief Complaint Chief Complaint  Patient presents with  . Patient Education  . Hyperglycemia    HPI Antonio Martin is a 26 y.o. male.  HPI  Pt comes in with cc of hyperglycemia. Pt has IDDM. He reports that he has not been able to give himself the lantus properly, as the lantus pen is jamming. Pt's blood sugar was reading high, so he came to the ER. He has been taking his sliding scale meds as prescribed. Pt denies nausea, emesis, fevers, chills, chest pains, shortness of breath, headaches, abdominal pain, uti like symptoms.   Past Medical History:  Diagnosis Date  . Diabetes mellitus without complication [facility](HCC)     Patient Active Problem List   Diagnosis Date Noted  . Hyperglycemia 07/30/2015  . Type 1 diabetes mellitus (HCC) 07/30/2015    History reviewed. No pertinent surgical history.     Home Medications    Prior to Admission medications   Medication Sig Start Date End Date Taking? Authorizing Provider  insulin aspart (NOVOLOG FLEXPEN) 100 UNIT/ML FlexPen Inject 10 Units into the skin 3 (three) times daily before meals. Per sliding scale   Yes Historical Provider, MD  insulin glargine (LANTUS) 100 unit/mL SOPN Inject 30 Units into the skin at bedtime.   Yes Historical Provider, MD  ondansetron (ZOFRAN ODT) 4 MG disintegrating tablet Take 1 tablet (4 mg total) by mouth every 8 (eight) hours as needed for nausea or vomiting. Patient not taking: Reported on 11/09/2016 05/30/16   Alvira MondayErin Schlossman, MD    Family History History reviewed. No pertinent family history.  Social History Social History  Substance Use Topics  . Smoking status: Never Smoker  . Smokeless tobacco: Never Used  . Alcohol use No     Allergies   Tape   Review of Systems Review of Systems  ROS 10 Systems reviewed and are negative for acute change except as noted in the  HPI.     Physical Exam Updated Vital Signs BP 118/79 (BP Location: Left Arm)   Pulse 105   Temp 97.9 F (36.6 C) (Oral)   Resp 18   Ht 5\' 2"  (1.575 m)   Wt 128 lb (58.1 kg)   SpO2 99%   BMI 23.41 kg/m   Physical Exam  Constitutional: He is oriented to person, place, and time. He appears well-developed.  HENT:  Head: Atraumatic.  Neck: Neck supple.  Cardiovascular: Normal rate.   Pulmonary/Chest: Effort normal.  Abdominal: Soft.  Neurological: He is alert and oriented to person, place, and time.  Skin: Skin is warm.  Nursing note and vitals reviewed.    ED Treatments / Results  Labs (all labs ordered are listed, but only abnormal results are displayed) Labs Reviewed  BASIC METABOLIC PANEL - Abnormal; Notable for the following:       Result Value   Sodium 131 (*)    Chloride 100 (*)    CO2 21 (*)    Glucose, Bld 543 (*)    Calcium 8.3 (*)    All other components within normal limits  CBC - Abnormal; Notable for the following:    HCT 38.8 (*)    MCHC 36.6 (*)    All other components within normal limits  CBG MONITORING, ED - Abnormal; Notable for the following:    Glucose-Capillary 509 (*)    All other components within normal limits  CBG MONITORING, ED - Abnormal; Notable for the following:    Glucose-Capillary 475 (*)    All other components within normal limits  CBG MONITORING, ED - Abnormal; Notable for the following:    Glucose-Capillary 310 (*)    All other components within normal limits  CBG MONITORING, ED    EKG  EKG Interpretation None       Radiology No results found.  Procedures Procedures (including critical care time)  Medications Ordered in ED Medications  sodium chloride 0.9 % bolus 1,000 mL (0 mLs Intravenous Stopped 11/09/16 1155)  insulin aspart (novoLOG) injection 12 Units (12 Units Intravenous Given 11/09/16 1044)     Initial Impression / Assessment and Plan / ED Course  I have reviewed the triage vital signs and the  nursing notes.  Pertinent labs & imaging results that were available during my care of the patient were reviewed by me and considered in my medical decision making (see chart for details).  Clinical Course     Pt with cc of elevated blood sugar. No DKA. We will hydrate, give some sliding scale insulin. Pharmacy team saw the patient, and agree that the pens are defective-  So patient advised to go to [facility] and exchange.  Final Clinical Impressions(s) / ED Diagnoses   Final diagnoses:  Hyperglycemia    New Prescriptions Discharge Medication List as of 11/09/2016 12:24 PM       Derwood Kaplan, MD 11/10/16 [phone]

## 2016-11-09 NOTE — Discharge Instructions (Signed)
[facility]

## 2016-12-10 ENCOUNTER — Encounter (HOSPITAL_COMMUNITY): Payer: Self-pay | Admitting: Emergency Medicine

## 2016-12-10 ENCOUNTER — Emergency Department (HOSPITAL_COMMUNITY)
Admission: EM | Admit: 2016-12-10 | Discharge: 2016-12-10 | Disposition: A | Discharge status: Home / Self Care | Payer: Medicaid Other | Attending: Emergency Medicine | Admitting: Emergency Medicine

## 2016-12-10 DIAGNOSIS — R1013 Epigastric pain: Secondary | ICD-10-CM | POA: Diagnosis not present

## 2016-12-10 DIAGNOSIS — E1065 Type 1 diabetes mellitus with hyperglycemia: Secondary | ICD-10-CM | POA: Diagnosis not present

## 2016-12-10 DIAGNOSIS — R739 Hyperglycemia, unspecified: Secondary | ICD-10-CM

## 2016-12-10 LAB — URINALYSIS, ROUTINE W REFLEX MICROSCOPIC
BACTERIA UA: NONE SEEN
BILIRUBIN URINE: NEGATIVE
Glucose, UA: >=500 mg/dL — AB
Hgb urine dipstick: NEGATIVE
Ketones, ur: NEGATIVE mg/dL
Leukocytes, UA: NEGATIVE
NITRITE: NEGATIVE
PH: 7 (ref 5.0–8.0)
Protein, ur: 100 mg/dL — AB
SPECIFIC GRAVITY, URINE: 1.013 (ref 1.005–1.030)
SQUAMOUS EPITHELIAL / LPF: NONE SEEN

## 2016-12-10 LAB — COMPREHENSIVE METABOLIC PANEL
ALK PHOS: 78 U/L (ref 38–126)
ALT: 15 U/L — AB (ref 17–63)
ANION GAP: 7 (ref 5–15)
AST: 21 U/L (ref 15–41)
Albumin: 3.2 g/dL — ABNORMAL LOW (ref 3.5–5.0)
BUN: 15 mg/dL (ref 6–20)
CALCIUM: 8.3 mg/dL — AB (ref 8.9–10.3)
CHLORIDE: 100 mmol/L — AB (ref 101–111)
CO2: 23 mmol/L (ref 22–32)
Creatinine, Ser: 1.14 mg/dL (ref 0.61–1.24)
GFR calc Af Amer: >60 mL/min (ref >60)
GFR calc non Af Amer: >60 mL/min (ref >60)
GLUCOSE: 621 mg/dL — AB (ref 65–99)
Potassium: 5.4 mmol/L — ABNORMAL HIGH (ref 3.5–5.1)
SODIUM: 130 mmol/L — AB (ref 135–145)
Total Bilirubin: 0.6 mg/dL (ref 0.3–1.2)
Total Protein: 6.1 g/dL — ABNORMAL LOW (ref 6.5–8.1)

## 2016-12-10 LAB — CBG MONITORING, ED
GLUCOSE-CAPILLARY: 593 mg/dL — AB (ref 65–99)
Glucose-Capillary: 563 mg/dL (ref 65–99)
Glucose-Capillary: 356 mg/dL — ABNORMAL HIGH (ref 65–99)

## 2016-12-10 LAB — BLOOD GAS, VENOUS
Acid-base deficit: 2 mmol/L (ref 0.0–2.0)
BICARBONATE: 23.8 mmol/L (ref 20.0–28.0)
O2 Saturation: 78 %
PATIENT TEMPERATURE: 98.6
PH VEN: 7.326 (ref 7.250–7.430)
pCO2, Ven: 46.9 mmHg (ref 44.0–60.0)
pO2, Ven: 45.8 mmHg — ABNORMAL HIGH (ref 32.0–45.0)

## 2016-12-10 LAB — LIPASE, BLOOD: Lipase: 29 U/L (ref 11–51)

## 2016-12-10 LAB — CBC WITH DIFFERENTIAL/PLATELET
BASOS PCT: 1 %
Basophils Absolute: 0.1 10*3/uL (ref 0.0–0.1)
Eosinophils Absolute: 1.5 10*3/uL — ABNORMAL HIGH (ref 0.0–0.7)
Eosinophils Relative: 21 %
HEMATOCRIT: 39 % (ref 39.0–52.0)
HEMOGLOBIN: 13.7 g/dL (ref 13.0–17.0)
LYMPHS ABS: 1.3 10*3/uL (ref 0.7–4.0)
Lymphocytes Relative: 19 %
MCH: 30.6 pg (ref 26.0–34.0)
MCHC: 35.1 g/dL (ref 30.0–36.0)
MCV: 87.1 fL (ref 78.0–100.0)
MONO ABS: 0.4 10*3/uL (ref 0.1–1.0)
MONOS PCT: 5 %
NEUTROS ABS: 4 10*3/uL (ref 1.7–7.7)
NEUTROS PCT: 54 %
Platelets: 252 10*3/uL (ref 150–400)
RBC: 4.48 MIL/uL (ref 4.22–5.81)
RDW: 12.2 % (ref 11.5–15.5)
WBC: 7.3 10*3/uL (ref 4.0–10.5)

## 2016-12-10 LAB — MAGNESIUM: MAGNESIUM: 1.7 mg/dL (ref 1.7–2.4)

## 2016-12-10 MED ORDER — INSULIN ASPART PROT & ASPART (70-30 MIX) 100 UNIT/ML ~~LOC~~ SUSP
4.0000 [IU] | Freq: Once | SUBCUTANEOUS | Status: AC
  Administered 2016-12-10: 4 [IU] via SUBCUTANEOUS

## 2016-12-10 MED ORDER — ONDANSETRON HCL 4 MG/2ML IJ SOLN: 4.0000 mg | Freq: Once | INTRAMUSCULAR | Status: DC

## 2016-12-10 MED ORDER — INSULIN ASPART PROT & ASPART (70-30 MIX) 100 UNIT/ML ~~LOC~~ SUSP
10.0000 [IU] | Freq: Once | SUBCUTANEOUS | Status: AC
  Administered 2016-12-10: 10 [IU] via SUBCUTANEOUS
  Filled 2016-12-10: qty 10

## 2016-12-10 MED ORDER — SODIUM CHLORIDE 0.9 % IV BOLUS (SEPSIS)
1000.0000 mL | Freq: Once | INTRAVENOUS | Status: AC
  Administered 2016-12-10: 1000 mL via INTRAVENOUS

## 2016-12-10 NOTE — ED Notes (Signed)
CBG=593

## 2016-12-10 NOTE — ED Triage Notes (Signed)
Per EMS patient comes from home for hyperglycemia, abd pain and vomiting.  Patient CBG initially with EMS was 595 given NS and came down to 568. Patient has 20g in Right AC.

## 2016-12-10 NOTE — ED Provider Notes (Signed)
[facility]-EMERGENCY DEPT Provider Note   CSN: [id number] Arrival date & time: 12/10/16  0830     History   Chief Complaint Chief Complaint  Patient presents with  . Hyperglycemia    HPI Antonio Martin is a 26 y.o. male.  The history is provided by the patient and the EMS personnel. No language interpreter was used.   Antonio Martin is a 26 y.o. male who presents to the Emergency Department complaining of hyperglycemia.  He presents by EMS for evaluation of hyperglycemia. He has a history of type 1 diabetes since the age of 8. He takes NovoLog 3 times a day with meals as well as Lantus daily at bedtime. He has been taking his medications as prescribed. Last night when he went to go to bed his blood sugar was in the 350s. Over the night he began to feel poorly with dizziness, excessive thirst, excessive urination, leg cramping and malaise.  This morning he developed emesis 2 with worsening malaise. No fevers, chest pain, cough, shortness of breath. No known sick contacts. He did not take his insulin this morning. On EMS arrival his blood sugar was in the 500s. Past Medical History:  Diagnosis Date  . Diabetes mellitus without complication [facility])     Patient Active Problem List   Diagnosis Date Noted  . Hyperglycemia 07/30/2015  . Type 1 diabetes mellitus (HCC) 07/30/2015    Past Surgical History:  Procedure Laterality Date  . EYE SURGERY    . FINGER SURGERY         Home Medications    Prior to Admission medications   Medication Sig Start Date End Date Taking? Authorizing Provider  insulin aspart (NOVOLOG FLEXPEN) 100 UNIT/ML FlexPen Inject 10 Units into the skin 3 (three) times daily before meals. Per sliding scale   Yes Historical Provider, MD  insulin glargine (LANTUS) 100 unit/mL SOPN Inject 30 Units into the skin at bedtime.   Yes Historical Provider, MD  ondansetron (ZOFRAN ODT) 4 MG disintegrating tablet Take 1 tablet (4 mg total) by mouth every 8 (eight) hours as  needed for nausea or vomiting. Patient not taking: Reported on 11/09/2016 05/30/16   Alvira Monday, MD    Family History No family history on file.  Social History Social History  Substance Use Topics  . Smoking status: Never Smoker  . Smokeless tobacco: Never Used  . Alcohol use Yes     Comment: occasional      Allergies   Tape   Review of Systems Review of Systems  All other systems reviewed and are negative.    Physical Exam Updated Vital Signs BP 167/98   Pulse 92   Temp 97.7 F (36.5 C) (Oral)   Resp 18   Ht 5\' 3"  (1.6 m)   Wt 130 lb 1.1 oz (59 kg)   SpO2 100%   BMI 23.04 kg/m   Physical Exam  Constitutional: He is oriented to person, place, and time. He appears well-developed and well-nourished.  HENT:  Head: Normocephalic and atraumatic.  Cardiovascular: Regular rhythm.   No murmur heard. Tachycardic  Pulmonary/Chest: Effort normal and breath sounds normal. No respiratory distress.  Abdominal: Soft.  Mild diffuse tenderness, greatest over the epigastrium. No guarding or rebound.  Musculoskeletal: He exhibits no edema or tenderness.  Neurological: He is alert and oriented to person, place, and time.  Skin: Skin is warm and dry.  Psychiatric: He has a normal mood and affect. His behavior is normal.  Nursing note and vitals reviewed.  ED Treatments / Results  Labs (all labs ordered are listed, but only abnormal results are displayed) Labs Reviewed  COMPREHENSIVE METABOLIC PANEL - Abnormal; Notable for the following:       Result Value   Sodium 130 (*)    Potassium 5.4 (*)    Chloride 100 (*)    Glucose, Bld 621 (*)    Calcium 8.3 (*)    Total Protein 6.1 (*)    Albumin 3.2 (*)    ALT 15 (*)    All other components within normal limits  CBC WITH DIFFERENTIAL/PLATELET - Abnormal; Notable for the following:    Eosinophils Absolute 1.5 (*)    All other components within normal limits  URINALYSIS, ROUTINE W REFLEX MICROSCOPIC - Abnormal;  Notable for the following:    Color, Urine COLORLESS (*)    Glucose, UA >=500 (*)    Protein, ur 100 (*)    All other components within normal limits  BLOOD GAS, VENOUS - Abnormal; Notable for the following:    pO2, Ven 45.8 (*)    All other components within normal limits  CBG MONITORING, ED - Abnormal; Notable for the following:    Glucose-Capillary 593 (*)    All other components within normal limits  CBG MONITORING, ED - Abnormal; Notable for the following:    Glucose-Capillary 563 (*)    All other components within normal limits  CBG MONITORING, ED - Abnormal; Notable for the following:    Glucose-Capillary 356 (*)    All other components within normal limits  LIPASE, BLOOD  MAGNESIUM    EKG  EKG Interpretation None       Radiology No results found.  Procedures Procedures (including critical care time)  Medications Ordered in ED Medications  sodium chloride 0.9 % bolus 1,000 mL (0 mLs Intravenous Stopped 12/10/16 0952)  sodium chloride 0.9 % bolus 1,000 mL (0 mLs Intravenous Stopped 12/10/16 1125)  insulin aspart protamine- aspart (NOVOLOG MIX 70/30) injection 10 Units (10 Units Subcutaneous Given 12/10/16 1028)  insulin aspart protamine- aspart (NOVOLOG MIX 70/30) injection 4 Units (4 Units Subcutaneous Given 12/10/16 1055)     Initial Impression / Assessment and Plan / ED Course  I have reviewed the triage vital signs and the nursing notes.  Pertinent labs & imaging results that were available during my care of the patient were reviewed by me and considered in my medical decision making (see chart for details).     Patient with history of type 1 diabetes here with hyperglycemia and vomiting. He is hyperglycemic lab studies with mild abdominal tenderness on initial evaluation. Following IV fluids and antiemetics his symptoms have significantly improved. There is no evidence of DKA. On repeat assessment he is tolerating oral fluids without difficulty. He states that  the symptoms were triggered after drinking a cream and sugar drink from [employer]. Counseled patient on home care for  diabetes with hyperglycemia, outpatient follow up and return precautions. Final Clinical Impressions(s) / ED Diagnoses   Final diagnoses:  Hyperglycemia    New Prescriptions Discharge Medication List as of 12/10/2016  1:00 PM       Tilden FossaElizabeth Florita Nitsch, MD 12/10/16 [phone]

## 2016-12-10 NOTE — ED Notes (Signed)
Bed: ZO10WA13 Expected date: 12/10/16 Expected time: 8:20 AM Means of arrival: Ambulance Comments: Hyperglycemia >500

## 2016-12-17 ENCOUNTER — Emergency Department (HOSPITAL_COMMUNITY)
Admission: EM | Admit: 2016-12-17 | Discharge: 2016-12-17 | Disposition: A | Discharge status: Home / Self Care | Payer: Medicaid Other | Attending: Emergency Medicine | Admitting: Emergency Medicine

## 2016-12-17 DIAGNOSIS — E1065 Type 1 diabetes mellitus with hyperglycemia: Secondary | ICD-10-CM | POA: Diagnosis present

## 2016-12-17 DIAGNOSIS — R739 Hyperglycemia, unspecified: Secondary | ICD-10-CM

## 2016-12-17 LAB — BLOOD GAS, VENOUS
Acid-base deficit: 0.5 mmol/L (ref 0.0–2.0)
BICARBONATE: 23.8 mmol/L (ref 20.0–28.0)
FIO2: 21
O2 SAT: 97.3 %
PATIENT TEMPERATURE: 98
PO2 VEN: 96 mmHg — AB (ref 32.0–45.0)
pCO2, Ven: 39.6 mmHg — ABNORMAL LOW (ref 44.0–60.0)
pH, Ven: 7.395 (ref 7.250–7.430)

## 2016-12-17 LAB — BASIC METABOLIC PANEL
ANION GAP: 8 (ref 5–15)
BUN: 14 mg/dL (ref 6–20)
CALCIUM: 8.2 mg/dL — AB (ref 8.9–10.3)
CO2: 22 mmol/L (ref 22–32)
Chloride: 98 mmol/L — ABNORMAL LOW (ref 101–111)
Creatinine, Ser: 1.05 mg/dL (ref 0.61–1.24)
GFR calc Af Amer: >60 mL/min (ref >60)
Glucose, Bld: 620 mg/dL (ref 65–99)
POTASSIUM: 4.6 mmol/L (ref 3.5–5.1)
SODIUM: 128 mmol/L — AB (ref 135–145)

## 2016-12-17 LAB — URINALYSIS, ROUTINE W REFLEX MICROSCOPIC
BACTERIA UA: NONE SEEN
Bilirubin Urine: NEGATIVE
Hgb urine dipstick: NEGATIVE
KETONES UR: NEGATIVE mg/dL
LEUKOCYTES UA: NEGATIVE
NITRITE: NEGATIVE
PROTEIN: 30 mg/dL — AB
SQUAMOUS EPITHELIAL / LPF: NONE SEEN
Specific Gravity, Urine: 1.012 (ref 1.005–1.030)
pH: 7 (ref 5.0–8.0)

## 2016-12-17 LAB — CBC WITH DIFFERENTIAL/PLATELET
BASOS ABS: 0.1 10*3/uL (ref 0.0–0.1)
Basophils Relative: 1 %
EOS ABS: 1.6 10*3/uL — AB (ref 0.0–0.7)
EOS PCT: 19 %
HCT: 36.9 % — ABNORMAL LOW (ref 39.0–52.0)
Hemoglobin: 13.2 g/dL (ref 13.0–17.0)
Lymphocytes Relative: 28 %
Lymphs Abs: 2.4 10*3/uL (ref 0.7–4.0)
MCH: 30.7 pg (ref 26.0–34.0)
MCHC: 35.8 g/dL (ref 30.0–36.0)
MCV: 85.8 fL (ref 78.0–100.0)
Monocytes Absolute: 0.5 10*3/uL (ref 0.1–1.0)
Monocytes Relative: 6 %
Neutro Abs: 3.9 10*3/uL (ref 1.7–7.7)
Neutrophils Relative %: 46 %
PLATELETS: 279 10*3/uL (ref 150–400)
RBC: 4.3 MIL/uL (ref 4.22–5.81)
RDW: 12.2 % (ref 11.5–15.5)
WBC: 8.5 10*3/uL (ref 4.0–10.5)

## 2016-12-17 LAB — CBG MONITORING, ED
GLUCOSE-CAPILLARY: 442 mg/dL — AB (ref 65–99)
Glucose-Capillary: 595 mg/dL (ref 65–99)
Glucose-Capillary: 344 mg/dL — ABNORMAL HIGH (ref 65–99)

## 2016-12-17 MED ORDER — ONDANSETRON HCL 4 MG/2ML IJ SOLN
4.0000 mg | Freq: Once | INTRAMUSCULAR | Status: DC
  Filled 2016-12-17: qty 2

## 2016-12-17 MED ORDER — SODIUM CHLORIDE 0.9 % IV BOLUS (SEPSIS)
1000.0000 mL | Freq: Once | INTRAVENOUS | Status: AC
  Administered 2016-12-17: 1000 mL via INTRAVENOUS

## 2016-12-17 MED ORDER — INSULIN ASPART 100 UNIT/ML ~~LOC~~ SOLN
10.0000 [IU] | Freq: Once | SUBCUTANEOUS | Status: AC
  Administered 2016-12-17: 10 [IU] via INTRAVENOUS
  Filled 2016-12-17: qty 1

## 2016-12-17 NOTE — ED Notes (Signed)
Per EMS- pt has had high sugar reading all day, not controllable at home- cbg of 551 for EMS.

## 2016-12-17 NOTE — ED Notes (Signed)
Bed: WA17 Expected date:  Expected time:  Means of arrival:  Comments: EMS 26 yo Diabetic with elevated blood sugar

## 2016-12-17 NOTE — Discharge Instructions (Signed)
You need to take your medications as directed.  Monitor your diet and sugars at home. Follow-up with your primary care doctor. Return here for new concerns.

## 2016-12-17 NOTE — ED Provider Notes (Signed)
[facility]-EMERGENCY DEPT Provider Note   CSN: [id number] Arrival date & time: 12/17/16  9562 By signing my name below, I, Levon Hedger, attest that this documentation has been prepared under the direction and in the presence of non-physician practitioner, Sharilyn Sites, PA-C. Electronically Signed: Levon Hedger, Scribe. 12/17/2016. 1:53 AM.   History   Chief Complaint Chief Complaint  Patient presents with  . Hyperglycemia   HPI Antonio Martin is a 26 y.o. male with a history of DM who presents to the Emergency Department complaining of persistent, gradually worsening hyperglycemia onset yesterday afternoon. Pt states he has taken his insulin as directed with no relief.  States sugar started out around 300 and has progressed to over 500 at home.  No alleviating or modifying factors noted. He notes associated polyuria, polydipsia, dizziness, and nausea. Pt denies any recent sugary food intake. Pt has no other complaints or symptoms at this time.   The history is provided by the patient. No language interpreter was used.   Past Medical History:  Diagnosis Date  . Diabetes mellitus without complication [facility])    Patient Active Problem List   Diagnosis Date Noted  . Hyperglycemia 07/30/2015  . Type 1 diabetes mellitus (HCC) 07/30/2015    Past Surgical History:  Procedure Laterality Date  . EYE SURGERY    . FINGER SURGERY      Home Medications    Prior to Admission medications   Medication Sig Start Date End Date Taking? Authorizing Provider  insulin aspart (NOVOLOG FLEXPEN) 100 UNIT/ML FlexPen Inject 10 Units into the skin 3 (three) times daily before meals. Per sliding scale    Historical Provider, MD  insulin glargine (LANTUS) 100 unit/mL SOPN Inject 30 Units into the skin at bedtime.    Historical Provider, MD  ondansetron (ZOFRAN ODT) 4 MG disintegrating tablet Take 1 tablet (4 mg total) by mouth every 8 (eight) hours as needed for nausea or vomiting. Patient not taking:  Reported on 11/09/2016 05/30/16   Alvira Monday, MD   Family History No family history on file.  Social History Social History  Substance Use Topics  . Smoking status: Never Smoker  . Smokeless tobacco: Never Used  . Alcohol use Yes     Comment: occasional      Allergies   Tape   Review of Systems Review of Systems  Gastrointestinal: Positive for nausea.  Endocrine: Positive for polydipsia and polyuria.  All other systems reviewed and are negative.  Physical Exam Updated Vital Signs BP 149/98   Pulse 94   Resp 19   SpO2 100%   Physical Exam  Constitutional: He is oriented to person, place, and time. He appears well-developed and well-nourished.  HENT:  Head: Normocephalic and atraumatic.  Mouth/Throat: Oropharynx is clear and moist.  Mildly dry mucous membranes  Eyes: Conjunctivae and EOM are normal. Pupils are equal, round, and reactive to light.  Neck: Normal range of motion.  Cardiovascular: Normal rate, regular rhythm and normal heart sounds.   Pulmonary/Chest: Effort normal and breath sounds normal. No respiratory distress. He has no wheezes.  Abdominal: Soft. Bowel sounds are normal. There is no tenderness. There is no rebound.  Musculoskeletal: Normal range of motion.  Neurological: He is alert and oriented to person, place, and time.  Skin: Skin is warm and dry.  Psychiatric: He has a normal mood and affect.  Nursing note and vitals reviewed.  ED Treatments / Results  DIAGNOSTIC STUDIES:  Oxygen Saturation is 100% on RA, normal by my interpretation.  COORDINATION OF CARE:  1:53 AM Discussed treatment plan with pt at bedside and pt agreed to plan.   Labs (all labs ordered are listed, but only abnormal results are displayed) Labs Reviewed  CBC WITH DIFFERENTIAL/PLATELET - Abnormal; Notable for the following:       Result Value   HCT 36.9 (*)    Eosinophils Absolute 1.6 (*)    All other components within normal limits  BASIC METABOLIC PANEL -  Abnormal; Notable for the following:    Sodium 128 (*)    Chloride 98 (*)    Glucose, Bld 620 (*)    Calcium 8.2 (*)    All other components within normal limits  URINALYSIS, ROUTINE W REFLEX MICROSCOPIC - Abnormal; Notable for the following:    Color, Urine COLORLESS (*)    Glucose, UA >=500 (*)    Protein, ur 30 (*)    All other components within normal limits  BLOOD GAS, VENOUS - Abnormal; Notable for the following:    pCO2, Ven 39.6 (*)    pO2, Ven 96.0 (*)    All other components within normal limits  CBG MONITORING, ED - Abnormal; Notable for the following:    Glucose-Capillary 595 (*)    All other components within normal limits  CBG MONITORING, ED - Abnormal; Notable for the following:    Glucose-Capillary 442 (*)    All other components within normal limits  CBG MONITORING, ED - Abnormal; Notable for the following:    Glucose-Capillary 344 (*)    All other components within normal limits    EKG  EKG Interpretation None      Radiology No results found.  Procedures Procedures (including critical care time)  Medications Ordered in ED Medications - No data to display   Initial Impression / Assessment and Plan / ED Course  I have reviewed the triage vital signs and the nursing notes.  Pertinent labs & imaging results that were available during my care of the patient were reviewed by me and considered in my medical decision making (see chart for details).  26 y.o. M here with hyperglycemia.  DM1, hx of frequently visits for uncontrolled glucose.  Patient is afebrile, nontoxic. His exam is overall benign aside from some mildly dry mucous membranes. Labwork with hyperglycemia, but no evidence of DKA. Normal pH, bicarbonate, and anion gap.  No ketones in the urine. Patient was fluid resuscitated here and given IV insulin with improvement of glucose from 620 to 341.  Given his improvement and no signs of DKA, feel he is stable for discharge. I suspect his uncontrolled  sugar is from noncompliance. He has no signs of infection on exam.  Will have him monitor diet at home, continue monitoring sugars.  Follow-up with PCP.  Discussed plan with patient, he acknowledged understanding and agreed with plan of care.  Return precautions given for new or worsening symptoms.  Final Clinical Impressions(s) / ED Diagnoses   Final diagnoses:  Hyperglycemia    New Prescriptions New Prescriptions   No medications on file   I personally performed the services described in this documentation, which was scribed in my presence. The recorded information has been reviewed and is accurate.   Garlon HatchetLisa M Tyronne Blann, PA-C 12/17/16 [id number]    April Palumbo, MD 12/17/16 [phone]

## 2017-02-12 ENCOUNTER — Emergency Department (HOSPITAL_COMMUNITY)
Admission: EM | Admit: 2017-02-12 | Discharge: 2017-02-12 | Disposition: A | Discharge status: Home / Self Care | Payer: Medicaid Other | Attending: Emergency Medicine | Admitting: Emergency Medicine

## 2017-02-12 DIAGNOSIS — R739 Hyperglycemia, unspecified: Secondary | ICD-10-CM

## 2017-02-12 DIAGNOSIS — Z79899 Other long term (current) drug therapy: Secondary | ICD-10-CM | POA: Diagnosis not present

## 2017-02-12 DIAGNOSIS — E1065 Type 1 diabetes mellitus with hyperglycemia: Secondary | ICD-10-CM | POA: Diagnosis not present

## 2017-02-12 LAB — COMPREHENSIVE METABOLIC PANEL
ALBUMIN: 3.1 g/dL — AB (ref 3.5–5.0)
ALK PHOS: 94 U/L (ref 38–126)
ALT: 17 U/L (ref 17–63)
AST: 19 U/L (ref 15–41)
Anion gap: 7 (ref 5–15)
BILIRUBIN TOTAL: 0.7 mg/dL (ref 0.3–1.2)
BUN: 15 mg/dL (ref 6–20)
CALCIUM: 8.6 mg/dL — AB (ref 8.9–10.3)
CO2: 23 mmol/L (ref 22–32)
Chloride: 100 mmol/L — ABNORMAL LOW (ref 101–111)
Creatinine, Ser: 1.07 mg/dL (ref 0.61–1.24)
GFR calc Af Amer: >60 mL/min (ref >60)
GFR calc non Af Amer: >60 mL/min (ref >60)
GLUCOSE: 526 mg/dL — AB (ref 65–99)
Potassium: 4.5 mmol/L (ref 3.5–5.1)
Sodium: 130 mmol/L — ABNORMAL LOW (ref 135–145)
TOTAL PROTEIN: 5.9 g/dL — AB (ref 6.5–8.1)

## 2017-02-12 LAB — CBC WITH DIFFERENTIAL/PLATELET
BASOS ABS: 0.1 10*3/uL (ref 0.0–0.1)
Basophils Relative: 1 %
EOS ABS: 1.6 10*3/uL — AB (ref 0.0–0.7)
EOS PCT: 19 %
HCT: 39.1 % (ref 39.0–52.0)
Hemoglobin: 13.8 g/dL (ref 13.0–17.0)
Lymphocytes Relative: 25 %
Lymphs Abs: 2.2 10*3/uL (ref 0.7–4.0)
MCH: 30.3 pg (ref 26.0–34.0)
MCHC: 35.3 g/dL (ref 30.0–36.0)
MCV: 85.9 fL (ref 78.0–100.0)
MONO ABS: 0.6 10*3/uL (ref 0.1–1.0)
Monocytes Relative: 7 %
Neutro Abs: 4.2 10*3/uL (ref 1.7–7.7)
Neutrophils Relative %: 48 %
Platelets: 287 10*3/uL (ref 150–400)
RBC: 4.55 MIL/uL (ref 4.22–5.81)
RDW: 11.7 % (ref 11.5–15.5)
WBC: 8.7 10*3/uL (ref 4.0–10.5)

## 2017-02-12 LAB — CBG MONITORING, ED
GLUCOSE-CAPILLARY: 487 mg/dL — AB (ref 65–99)
Glucose-Capillary: 87 mg/dL (ref 65–99)

## 2017-02-12 LAB — LIPASE, BLOOD: Lipase: 32 U/L (ref 11–51)

## 2017-02-12 MED ORDER — SODIUM CHLORIDE 0.9 % IV BOLUS (SEPSIS)
1000.0000 mL | Freq: Once | INTRAVENOUS | Status: AC
  Administered 2017-02-12: 1000 mL via INTRAVENOUS

## 2017-02-12 MED ORDER — INSULIN ASPART 100 UNIT/ML ~~LOC~~ SOLN
10.0000 [IU] | Freq: Once | SUBCUTANEOUS | Status: DC
  Filled 2017-02-12: qty 1

## 2017-02-12 MED ORDER — INSULIN ASPART 100 UNIT/ML ~~LOC~~ SOLN
10.0000 [IU] | Freq: Once | SUBCUTANEOUS | Status: AC
  Administered 2017-02-12: 10 [IU] via INTRAVENOUS
  Filled 2017-02-12: qty 1

## 2017-02-12 MED ORDER — SODIUM CHLORIDE 0.9 % IV BOLUS (SEPSIS): 1000.0000 mL | Freq: Once | INTRAVENOUS | Status: DC

## 2017-02-12 MED ORDER — INSULIN ASPART 100 UNIT/ML IV SOLN: 10.0000 [IU] | Freq: Once | INTRAVENOUS | Status: DC

## 2017-02-12 NOTE — ED Notes (Signed)
Nurse currently drawing labs 

## 2017-02-12 NOTE — ED Provider Notes (Signed)
[facility]-EMERGENCY DEPT Provider Note   CSN: [id number] Arrival date & time: 02/12/17  0148   By signing my name below, I, Clovis Pu, attest that this documentation has been prepared under the direction and in the presence of Gilda Crease, MD  Electronically Signed: Clovis Pu, ED Scribe. 02/12/17. 2:15 AM.   History   Chief Complaint Chief Complaint  Patient presents with  . Hyperglycemia    HPI Comments:  Antonio Martin is a 26 y.o. male, with a PMHx of DM on Lantus, who presents to the Emergency Department, via EMS, complaining of acute onset elevated blood sugar levels x yesterday. Pt also report nausea, vomiting, abdominal pain secondary to vomiting and an occasional cough. No alleviating or exacerbating factors noted. Pt denies any other associated symptoms. No other complaints noted at this time.   The history is provided by the patient. No language interpreter was used.    Past Medical History:  Diagnosis Date  . Diabetes mellitus without complication [facility])     Patient Active Problem List   Diagnosis Date Noted  . Hyperglycemia 07/30/2015  . Type 1 diabetes mellitus (HCC) 07/30/2015    Past Surgical History:  Procedure Laterality Date  . EYE SURGERY    . FINGER SURGERY         Home Medications    Prior to Admission medications   Medication Sig Start Date End Date Taking? Authorizing Provider  insulin aspart (NOVOLOG FLEXPEN) 100 UNIT/ML FlexPen Inject 10 Units into the skin 3 (three) times daily before meals. Per sliding scale   Yes Historical Provider, MD  insulin glargine (LANTUS) 100 unit/mL SOPN Inject 30 Units into the skin at bedtime.   Yes Historical Provider, MD  ondansetron (ZOFRAN ODT) 4 MG disintegrating tablet Take 1 tablet (4 mg total) by mouth every 8 (eight) hours as needed for nausea or vomiting. Patient not taking: Reported on 11/09/2016 05/30/16   Alvira Monday, MD    Family History No family history on file.  Social  History Social History  Substance Use Topics  . Smoking status: Never Smoker  . Smokeless tobacco: Never Used  . Alcohol use Yes     Comment: occasional      Allergies   Tape   Review of Systems Review of Systems  Constitutional: Negative for fever.  Respiratory: Positive for cough.   Gastrointestinal: Positive for nausea and vomiting.  All other systems reviewed and are negative.    Physical Exam Updated Vital Signs BP (!) 142/88   Pulse 74   Temp 98.4 F (36.9 C) (Oral)   Resp 14   SpO2 99%   Physical Exam  Constitutional: He is oriented to person, place, and time. He appears well-developed and well-nourished. No distress.  HENT:  Head: Normocephalic and atraumatic.  Right Ear: Hearing normal.  Left Ear: Hearing normal.  Nose: Nose normal.  Mouth/Throat: Oropharynx is clear and moist and mucous membranes are normal.  Eyes: Conjunctivae and EOM are normal. Pupils are equal, round, and reactive to light.  Neck: Normal range of motion. Neck supple.  Cardiovascular: Regular rhythm, S1 normal and S2 normal.  Exam reveals no gallop and no friction rub.   No murmur heard. Pulmonary/Chest: Effort normal and breath sounds normal. No respiratory distress. He exhibits no tenderness.  Abdominal: Soft. Normal appearance and bowel sounds are normal. There is no hepatosplenomegaly. There is no tenderness. There is no rebound, no guarding, no tenderness at McBurney's point and negative Murphy's sign. No hernia.  Musculoskeletal:  Normal range of motion.  Neurological: He is alert and oriented to person, place, and time. He has normal strength. No cranial nerve deficit or sensory deficit. Coordination normal. GCS eye subscore is 4. GCS verbal subscore is 5. GCS motor subscore is 6.  Skin: Skin is warm, dry and intact. No rash noted. No cyanosis.  Psychiatric: He has a normal mood and affect. His speech is normal and behavior is normal. Thought content normal.  Nursing note and  vitals reviewed.    ED Treatments / Results  DIAGNOSTIC STUDIES:  Oxygen Saturation is 97% on RA, normal by my interpretation.    COORDINATION OF CARE:  2:14 AM Discussed treatment plan with pt at bedside and pt agreed to plan.  Labs (all labs ordered are listed, but only abnormal results are displayed) Labs Reviewed  CBC WITH DIFFERENTIAL/PLATELET - Abnormal; Notable for the following:       Result Value   Eosinophils Absolute 1.6 (*)    All other components within normal limits  COMPREHENSIVE METABOLIC PANEL - Abnormal; Notable for the following:    Sodium 130 (*)    Chloride 100 (*)    Glucose, Bld 526 (*)    Calcium 8.6 (*)    Total Protein 5.9 (*)    Albumin 3.1 (*)    All other components within normal limits  CBG MONITORING, ED - Abnormal; Notable for the following:    Glucose-Capillary 487 (*)    All other components within normal limits  LIPASE, BLOOD  CBG MONITORING, ED    EKG  EKG Interpretation None       Radiology No results found.  Procedures Procedures (including critical care time)  Medications Ordered in ED Medications  sodium chloride 0.9 % bolus 1,000 mL (1,000 mLs Intravenous Refused 02/12/17 0711)  insulin aspart (novoLOG) injection 10 Units (10 Units Intravenous Refused 02/12/17 0711)  sodium chloride 0.9 % bolus 1,000 mL (0 mLs Intravenous Stopped 02/12/17 0434)  insulin aspart (novoLOG) injection 10 Units (10 Units Intravenous Given 02/12/17 0256)     Initial Impression / Assessment and Plan / ED Course  I have reviewed the triage vital signs and the nursing notes.  Pertinent labs & imaging results that were available during my care of the patient were reviewed by me and considered in my medical decision making (see chart for details).   presents with complaints of elevated blood sugar. Examination is entirely normal and benign. Vital signs are stable. Blood work does not reveal any acidosis or anion gap. No concern for DKA. Patient  given IV fluids and insulin and hyperglycemia has resolved.  Final Clinical Impressions(s) / ED Diagnoses   Final diagnoses:  Hyperglycemia    New Prescriptions New Prescriptions   No medications on file  I personally performed the services described in this documentation, which was scribed in my presence. The recorded information has been reviewed and is accurate.    Gilda Crease, MD 02/12/17 [phone]

## 2017-02-12 NOTE — ED Triage Notes (Signed)
Per EMS pt has blood sugars of 522. Pt said he had vomited once before they had gotten there. Pt stated he was very thirsty. 200 fluid bolus per EMS 170/86 p-80 rr 16.

## 2017-03-26 ENCOUNTER — Encounter (HOSPITAL_COMMUNITY): Payer: Self-pay | Admitting: Emergency Medicine

## 2017-03-26 ENCOUNTER — Emergency Department (HOSPITAL_COMMUNITY)
Admission: EM | Admit: 2017-03-26 | Discharge: 2017-03-26 | Disposition: A | Discharge status: Home / Self Care | Payer: Medicaid Other | Attending: Emergency Medicine | Admitting: Emergency Medicine

## 2017-03-26 DIAGNOSIS — E1065 Type 1 diabetes mellitus with hyperglycemia: Secondary | ICD-10-CM | POA: Insufficient documentation

## 2017-03-26 DIAGNOSIS — Z794 Long term (current) use of insulin: Secondary | ICD-10-CM | POA: Diagnosis not present

## 2017-03-26 DIAGNOSIS — R739 Hyperglycemia, unspecified: Secondary | ICD-10-CM

## 2017-03-26 LAB — CBC
HEMATOCRIT: 41 % (ref 39.0–52.0)
HEMOGLOBIN: 14.3 g/dL (ref 13.0–17.0)
MCH: 30 pg (ref 26.0–34.0)
MCHC: 34.9 g/dL (ref 30.0–36.0)
MCV: 86 fL (ref 78.0–100.0)
Platelets: 253 10*3/uL (ref 150–400)
RBC: 4.77 MIL/uL (ref 4.22–5.81)
RDW: 11.7 % (ref 11.5–15.5)
WBC: 8.4 10*3/uL (ref 4.0–10.5)

## 2017-03-26 LAB — URINALYSIS, ROUTINE W REFLEX MICROSCOPIC
BACTERIA UA: NONE SEEN
BILIRUBIN URINE: NEGATIVE
Glucose, UA: >=500 mg/dL — AB
Hgb urine dipstick: NEGATIVE
Ketones, ur: NEGATIVE mg/dL
Leukocytes, UA: NEGATIVE
Nitrite: NEGATIVE
PH: 7 (ref 5.0–8.0)
PROTEIN: 100 mg/dL — AB
SQUAMOUS EPITHELIAL / LPF: NONE SEEN
Specific Gravity, Urine: 1.015 (ref 1.005–1.030)

## 2017-03-26 LAB — BASIC METABOLIC PANEL
ANION GAP: 7 (ref 5–15)
BUN: 15 mg/dL (ref 6–20)
CHLORIDE: 92 mmol/L — AB (ref 101–111)
CO2: 24 mmol/L (ref 22–32)
Calcium: 8.6 mg/dL — ABNORMAL LOW (ref 8.9–10.3)
Creatinine, Ser: 0.98 mg/dL (ref 0.61–1.24)
GFR calc Af Amer: >60 mL/min (ref >60)
GFR calc non Af Amer: >60 mL/min (ref >60)
GLUCOSE: 515 mg/dL — AB (ref 65–99)
POTASSIUM: 3.5 mmol/L (ref 3.5–5.1)
SODIUM: 123 mmol/L — AB (ref 135–145)

## 2017-03-26 LAB — CBG MONITORING, ED
GLUCOSE-CAPILLARY: 444 mg/dL — AB (ref 65–99)
GLUCOSE-CAPILLARY: 188 mg/dL — AB (ref 65–99)

## 2017-03-26 MED ORDER — INSULIN ASPART 100 UNIT/ML ~~LOC~~ SOLN
12.0000 [IU] | Freq: Once | SUBCUTANEOUS | Status: AC
  Administered 2017-03-26: 12 [IU] via INTRAVENOUS
  Filled 2017-03-26: qty 1

## 2017-03-26 MED ORDER — SODIUM CHLORIDE 0.9 % IV BOLUS (SEPSIS)
1000.0000 mL | Freq: Once | INTRAVENOUS | Status: AC
  Administered 2017-03-26: 1000 mL via INTRAVENOUS

## 2017-03-26 NOTE — ED Notes (Signed)
EDP at bedside  

## 2017-03-26 NOTE — ED Notes (Signed)
IV attempt x 2 with no success.

## 2017-03-26 NOTE — ED Triage Notes (Signed)
Per EMS pt is a type I diabetic who called d/t his blood sugar reading 480 at home. Their CBG was 488.  Pt complaint of headache and nausea.  Pt takes Toujeo and Novolog at home and states he has been taking his medication as directed. Pt is somnolent during triage and hard to hold his focus during assessment.  Pt is ambulatory.  A&O x 4.  VSS 146/78, 88, 18, 100% with EMS

## 2017-03-26 NOTE — ED Provider Notes (Signed)
[facility]-EMERGENCY DEPT Provider Note   CSN: [id number] Arrival date & time: 03/26/17  0320     History   Chief Complaint Chief Complaint  Patient presents with  . Hyperglycemia    HPI Antonio Martin is a 26 y.o. male.  HPI Patient presents emergency department with complaints of elevated blood sugars at home.  He states his been taking his NovoLog 3 times a day as scheduled in taking his long-acting insulin at night.  No recent changes to his medications.  Denies fevers and chills.  No chest pain or shortness of breath.  He pulled his blood sugar to be elevated to tonight so he called EMS.  He is not on any sliding scale insulin.  He reports some dietary indiscretion.   Past Medical History:  Diagnosis Date  . Diabetes mellitus without complication [facility])     Patient Active Problem List   Diagnosis Date Noted  . Hyperglycemia 07/30/2015  . Type 1 diabetes mellitus (HCC) 07/30/2015    Past Surgical History:  Procedure Laterality Date  . EYE SURGERY    . FINGER SURGERY         Home Medications    Prior to Admission medications   Medication Sig Start Date End Date Taking? Authorizing Provider  insulin aspart (NOVOLOG FLEXPEN) 100 UNIT/ML FlexPen Inject 10 Units into the skin 3 (three) times daily before meals. Per sliding scale   Yes [provider]  TOUJEO SOLOSTAR 300 UNIT/ML SOPN Inject 35 Units into the skin at bedtime. 03/15/17  Yes [provider]  ondansetron (ZOFRAN ODT) 4 MG disintegrating tablet Take 1 tablet (4 mg total) by mouth every 8 (eight) hours as needed for nausea or vomiting. Patient not taking: Reported on 11/09/2016 05/30/16   Alvira Monday, MD    Family History No family history on file.  Social History Social History  Substance Use Topics  . Smoking status: Never Smoker  . Smokeless tobacco: Never Used  . Alcohol use Yes     Comment: occasional      Allergies   Tape   Review of Systems Review of Systems  All  other systems reviewed and are negative.    Physical Exam Updated Vital Signs BP (!) 146/89   Pulse 87   Temp 97.8 F (36.6 C) (Oral)   Resp 14   SpO2 99%   Physical Exam  Constitutional: He is oriented to person, place, and time. He appears well-developed and well-nourished.  HENT:  Head: Normocephalic and atraumatic.  Eyes: EOM are normal.  Neck: Normal range of motion.  Cardiovascular: Normal rate, regular rhythm, normal heart sounds and intact distal pulses.   Pulmonary/Chest: Effort normal and breath sounds normal. No respiratory distress.  Abdominal: Soft. He exhibits no distension. There is no tenderness.  Musculoskeletal: Normal range of motion.  Neurological: He is alert and oriented to person, place, and time.  Skin: Skin is warm and dry.  Psychiatric: He has a normal mood and affect. Judgment normal.  Nursing note and vitals reviewed.    ED Treatments / Results  Labs (all labs ordered are listed, but only abnormal results are displayed) Labs Reviewed  BASIC METABOLIC PANEL - Abnormal; Notable for the following:       Result Value   Sodium 123 (*)    Chloride 92 (*)    Glucose, Bld 515 (*)    Calcium 8.6 (*)    All other components within normal limits  URINALYSIS, ROUTINE W REFLEX MICROSCOPIC - Abnormal; Notable  for the following:    Color, Urine COLORLESS (*)    Glucose, UA >=500 (*)    Protein, ur 100 (*)    All other components within normal limits  CBG MONITORING, ED - Abnormal; Notable for the following:    Glucose-Capillary 444 (*)    All other components within normal limits  CBG MONITORING, ED - Abnormal; Notable for the following:    Glucose-Capillary 188 (*)    All other components within normal limits  CBC    EKG  EKG Interpretation None       Radiology No results found.  Procedures Procedures (including critical care time)  Medications Ordered in ED Medications - No data to display   Initial Impression / Assessment and  Plan / ED Course  I have reviewed the triage vital signs and the nursing notes.  Pertinent labs & imaging results that were available during my care of the patient were reviewed by me and considered in my medical decision making (see chart for details).     Blood sugar improving here in the emergency department with insulin and fluids.  Patient will need follow-up with his primary care physician.  I recommended he ask his primary care doctor for referral to a endocrinologist.  Overall well-appearing.  Vital signs stable.  Discharge home in good condition.  Patient understands to return to the ER for new or worsening symptoms   Final Clinical Impressions(s) / ED Diagnoses   Final diagnoses:  Hyperglycemia    New Prescriptions New Prescriptions   No medications on file     Azalia Bilisampos, Kashif Pooler, MD 03/26/17 [phone]

## 2017-11-13 ENCOUNTER — Encounter (HOSPITAL_COMMUNITY): Payer: Self-pay

## 2017-11-13 ENCOUNTER — Other Ambulatory Visit: Payer: Self-pay

## 2017-11-13 ENCOUNTER — Emergency Department (HOSPITAL_COMMUNITY)
Admission: EM | Admit: 2017-11-13 | Discharge: 2017-11-13 | Disposition: A | Discharge status: Home / Self Care | Payer: Medicaid Other | Attending: Emergency Medicine | Admitting: Emergency Medicine

## 2017-11-13 ENCOUNTER — Emergency Department (HOSPITAL_COMMUNITY): Payer: Medicaid Other

## 2017-11-13 DIAGNOSIS — E1010 Type 1 diabetes mellitus with ketoacidosis without coma: Secondary | ICD-10-CM | POA: Insufficient documentation

## 2017-11-13 DIAGNOSIS — R42 Dizziness and giddiness: Secondary | ICD-10-CM | POA: Diagnosis present

## 2017-11-13 DIAGNOSIS — Z5321 Procedure and treatment not carried out due to patient leaving prior to being seen by health care provider: Secondary | ICD-10-CM | POA: Insufficient documentation

## 2017-11-13 LAB — BASIC METABOLIC PANEL
ANION GAP: 11 (ref 5–15)
BUN: 19 mg/dL (ref 6–20)
CALCIUM: 8.6 mg/dL — AB (ref 8.9–10.3)
CHLORIDE: 95 mmol/L — AB (ref 101–111)
CO2: 19 mmol/L — AB (ref 22–32)
Creatinine, Ser: 1.39 mg/dL — ABNORMAL HIGH (ref 0.61–1.24)
GFR calc Af Amer: >60 mL/min (ref >60)
GFR calc non Af Amer: >60 mL/min (ref >60)
GLUCOSE: 796 mg/dL — AB (ref 65–99)
POTASSIUM: 4.9 mmol/L (ref 3.5–5.1)
Sodium: 125 mmol/L — ABNORMAL LOW (ref 135–145)

## 2017-11-13 LAB — I-STAT VENOUS BLOOD GAS, ED
ACID-BASE DEFICIT: 3 mmol/L — AB (ref 0.0–2.0)
BICARBONATE: 24.5 mmol/L (ref 20.0–28.0)
O2 Saturation: 57 %
PH VEN: 7.28 (ref 7.250–7.430)
TCO2: 26 mmol/L (ref 22–32)
pCO2, Ven: 52.2 mmHg (ref 44.0–60.0)
pO2, Ven: 34 mmHg (ref 32.0–45.0)

## 2017-11-13 LAB — I-STAT CHEM 8, ED
BUN: 24 mg/dL — AB (ref 6–20)
CALCIUM ION: 1.2 mmol/L (ref 1.15–1.40)
Chloride: 94 mmol/L — ABNORMAL LOW (ref 101–111)
Creatinine, Ser: 1.2 mg/dL (ref 0.61–1.24)
Glucose, Bld: 664 mg/dL (ref 65–99)
HEMATOCRIT: 44 % (ref 39.0–52.0)
Hemoglobin: 15 g/dL (ref 13.0–17.0)
Potassium: 4.4 mmol/L (ref 3.5–5.1)
Sodium: 131 mmol/L — ABNORMAL LOW (ref 135–145)
TCO2: 26 mmol/L (ref 22–32)

## 2017-11-13 LAB — CBG MONITORING, ED: Glucose-Capillary: 381 mg/dL — ABNORMAL HIGH (ref 65–99)

## 2017-11-13 LAB — CBC
HEMATOCRIT: 40 % (ref 39.0–52.0)
Hemoglobin: 13.8 g/dL (ref 13.0–17.0)
MCH: 30.5 pg (ref 26.0–34.0)
MCHC: 34.5 g/dL (ref 30.0–36.0)
MCV: 88.3 fL (ref 78.0–100.0)
Platelets: 272 10*3/uL (ref 150–400)
RBC: 4.53 MIL/uL (ref 4.22–5.81)
RDW: 11.9 % (ref 11.5–15.5)
WBC: 8.5 10*3/uL (ref 4.0–10.5)

## 2017-11-13 LAB — URINALYSIS, ROUTINE W REFLEX MICROSCOPIC
BACTERIA UA: NONE SEEN
Bilirubin Urine: NEGATIVE
Glucose, UA: >=500 mg/dL — AB
Hgb urine dipstick: NEGATIVE
Ketones, ur: NEGATIVE mg/dL
Leukocytes, UA: NEGATIVE
Nitrite: NEGATIVE
PROTEIN: 100 mg/dL — AB
SPECIFIC GRAVITY, URINE: 1.015 (ref 1.005–1.030)
SQUAMOUS EPITHELIAL / LPF: NONE SEEN
pH: 7 (ref 5.0–8.0)

## 2017-11-13 MED ORDER — SODIUM CHLORIDE 0.9 % IV BOLUS (SEPSIS)
1000.0000 mL | Freq: Once | INTRAVENOUS | Status: AC
  Administered 2017-11-13: 1000 mL via INTRAVENOUS

## 2017-11-13 MED ORDER — SODIUM CHLORIDE 0.9 % IV SOLN: INTRAVENOUS | Status: DC

## 2017-11-13 MED ORDER — POTASSIUM CHLORIDE 10 MEQ/100ML IV SOLN: 10.0000 meq | INTRAVENOUS | Status: AC

## 2017-11-13 MED ORDER — DEXTROSE-NACL 5-0.45 % IV SOLN: INTRAVENOUS | Status: DC

## 2017-11-13 MED ORDER — SODIUM CHLORIDE 0.9 % IV SOLN
INTRAVENOUS | Status: DC
  Filled 2017-11-13: qty 1

## 2017-11-13 NOTE — ED Notes (Signed)
Checked pt's CBG and he states he wants to go home. PA advised of same.

## 2017-11-13 NOTE — ED Provider Notes (Addendum)
Medical screening examination/treatment/procedure(s) were conducted as a shared visit with non-physician practitioner(s) and myself.  I personally evaluated the patient during the encounter.   EKG Interpretation None     Patient reports he is compliant with his insulin.  He describes taking his nighttime Tuojeo and his sliding scale insulin.  Despite this blood sugar has been elevating.  He reports now he has developed nausea and vomiting.  Patient is alert and nontoxic.  Mental status clear.  No respiratory distress.  Lungs clear.  Heart regular.  Abdomen soft.  Patient has severe hyperglycemia with mild acidosis despite insulin compliance.  Will initiate insulin drip and plan to admit.  I agree with plan of management.   Arby BarrettePfeiffer, Kimon Loewen, MD 11/13/17 [phone] Felt patient needed to be admitted as he describes compliance with his insulin but has severely elevated blood sugar.  After improvement with fluids, patient reported he wanted to sign out AGAINST MEDICAL ADVICE.  He was advised by PA-C of significant risk of worsening.  He is excepted risk and is signed out.   Arby BarrettePfeiffer, Jaquin Coy, MD 11/14/17 [phone]

## 2017-11-13 NOTE — ED Triage Notes (Addendum)
Pt arrives EMS from home with c/o  RLQ pain and nausea with vomiting and last insulin last night. CBG reads "HI" on arrival.c/o headache. Pt states he vomits when he tries to eat. States he took 12 units of insulin this AM.

## 2017-11-13 NOTE — ED Notes (Addendum)
Pt is refusing to have a second IV placed. Pt is also refusing admission and wishing to speak to the doctor or provider taking care of him. PA [place] notified and speaking with pt.

## 2017-11-13 NOTE — ED Provider Notes (Signed)
[facility] EMERGENCY DEPARTMENT Provider Note   CSN: [id number] Arrival date & time: 11/13/17  1623     History   Chief Complaint Chief Complaint  Patient presents with  . Dizziness  . Nausea  . Headache  . Emesis    HPI Antonio Martin is a 27 y.o. male with past medical history of type 1 diabetes, presenting to the ED for hyperglycemia.  Patient states he began feeling ill overnight, with nausea and headache.  He states that around 530 this morning he took his blood glucose level which was 420.  He states he did not take any insulin at that time because he did not get up to have a meal.  He states around 8:30 in the morning he took it again and he was even higher.  At that time he took 14 units of insulin and had a small meal.  He states throughout the day he was checking his glucose level which was increasing, with last read showing as "high."  He has been having associated nausea and vomiting with decreased appetite today. Denies abdominal pain. He states he usually takes his medications as prescribed, with sliding scale NovoLog with meals and 35u Toujeo at bedtime.  Reports he has had a nonproductive cough for the past week, however denies fever/chills, congestion, shortness of breath, chest pain, urinary sx or other complaints. No recent injuries.  The history is provided by the patient.    Past Medical History:  Diagnosis Date  . Diabetes mellitus without complication [facility])     Patient Active Problem List   Diagnosis Date Noted  . Hyperglycemia 07/30/2015  . Type 1 diabetes mellitus (HCC) 07/30/2015    Past Surgical History:  Procedure Laterality Date  . EYE SURGERY    . FINGER SURGERY         Home Medications    Prior to Admission medications   Medication Sig Start Date End Date Taking? Authorizing Provider  insulin aspart (NOVOLOG FLEXPEN) 100 UNIT/ML FlexPen Inject 12 Units into the skin 3 (three) times daily before meals. Per sliding  scale   Yes [provider]  TOUJEO SOLOSTAR 300 UNIT/ML SOPN Inject 40 Units into the skin at bedtime.  03/15/17  Yes [provider]  ondansetron (ZOFRAN ODT) 4 MG disintegrating tablet Take 1 tablet (4 mg total) by mouth every 8 (eight) hours as needed for nausea or vomiting. Patient not taking: Reported on 11/13/2017 05/30/16   Alvira Monday, MD    Family History No family history on file.  Social History Social History   Tobacco Use  . Smoking status: Never Smoker  . Smokeless tobacco: Never Used  Substance Use Topics  . Alcohol use: Yes    Comment: occasional   . Drug use: No     Allergies   Tape   Review of Systems Review of Systems  Constitutional: Negative for appetite change, chills and fever.  HENT: Negative for congestion and sore throat.   Eyes: Negative for photophobia and visual disturbance.  Respiratory: Positive for cough. Negative for shortness of breath.   Cardiovascular: Negative for chest pain.  Gastrointestinal: Positive for nausea and vomiting. Negative for abdominal pain, blood in stool, constipation and diarrhea.  Endocrine:       Hyperglycemia  Genitourinary: Negative for dysuria and frequency.  Allergic/Immunologic: Positive for immunocompromised state (T1DM).  Neurological: Positive for light-headedness and headaches.  All other systems reviewed and are negative.    Physical Exam Updated Vital Signs BP Marland Kitchen)  189/94   Pulse 82   Temp 98.1 F (36.7 C) (Oral)   Resp 16   Ht 5\' 3"  (1.6 m)   Wt 58 kg (127 lb 13.9 oz)   SpO2 100%   BMI 22.65 kg/m   Physical Exam  Constitutional: He is oriented to person, place, and time. He appears well-developed and well-nourished. No distress.  HENT:  Head: Normocephalic and atraumatic.  Mouth/Throat: Oropharynx is clear and moist.  Eyes: Conjunctivae and EOM are normal. Pupils are equal, round, and reactive to light.  Neck: Normal range of motion. Neck supple.  Cardiovascular:  Normal rate, regular rhythm, normal heart sounds and intact distal pulses.  Pulmonary/Chest: Effort normal. No stridor. No respiratory distress. He has wheezes. He has no rales.  Abdominal: Soft. Bowel sounds are normal. He exhibits no distension. There is no tenderness. There is no rebound and no guarding.  Neurological: He is alert and oriented to person, place, and time.  Skin: Skin is warm.  Psychiatric: He has a normal mood and affect. His behavior is normal.  Nursing note and vitals reviewed.    ED Treatments / Results  Labs (all labs ordered are listed, but only abnormal results are displayed) Labs Reviewed  BASIC METABOLIC PANEL - Abnormal; Notable for the following components:      Result Value   Sodium 125 (*)    Chloride 95 (*)    CO2 19 (*)    Glucose, Bld 796 (*)    Creatinine, Ser 1.39 (*)    Calcium 8.6 (*)    All other components within normal limits  URINALYSIS, ROUTINE W REFLEX MICROSCOPIC - Abnormal; Notable for the following components:   Color, Urine STRAW (*)    Glucose, UA >=500 (*)    Protein, ur 100 (*)    All other components within normal limits  CBG MONITORING, ED - Abnormal; Notable for the following components:   Glucose-Capillary >600 (*)    All other components within normal limits  I-STAT VENOUS BLOOD GAS, ED - Abnormal; Notable for the following components:   Acid-base deficit 3.0 (*)    All other components within normal limits  I-STAT CHEM 8, ED - Abnormal; Notable for the following components:   Sodium 131 (*)    Chloride 94 (*)    BUN 24 (*)    Glucose, Bld 664 (*)    All other components within normal limits  CBG MONITORING, ED - Abnormal; Notable for the following components:   Glucose-Capillary 381 (*)    All other components within normal limits  CBC    EKG  EKG Interpretation None       Radiology Dg Chest Port 1 View  Result Date: 11/13/2017 CLINICAL DATA:  Right lower quadrant pain, nausea vomiting. EXAM: PORTABLE  CHEST 1 VIEW COMPARISON:  07/29/2015 FINDINGS: Cardiomediastinal silhouette is normal. Mediastinal contours appear intact. There is no evidence of focal airspace consolidation, pleural effusion or pneumothorax. Osseous structures are without acute abnormality. Soft tissues are grossly normal. IMPRESSION: No active disease. Electronically Signed   By: Ted Mcalpineobrinka  Dimitrova M.D.   On: 11/13/2017 18:42    Procedures Procedures (including critical care time) CRITICAL CARE Performed by: [place] N Sabrin Dunlevy   Total critical care time: 45 minutes  Critical care time was exclusive of separately billable procedures and treating other patients.  Critical care was necessary to treat or prevent imminent or life-threatening deterioration.  Critical care was time spent personally by me on the following activities: development of treatment  plan with patient and/or surrogate as well as nursing, discussions with consultants, evaluation of patient's response to treatment, examination of patient, obtaining history from patient or surrogate, ordering and performing treatments and interventions, ordering and review of laboratory studies, ordering and review of radiographic studies, pulse oximetry and re-evaluation of patient's condition.   Medications Ordered in ED Medications  insulin regular (NOVOLIN R,HUMULIN R) 100 Units in sodium chloride 0.9 % 100 mL (1 Units/mL) infusion (not administered)  dextrose 5 %-0.45 % sodium chloride infusion (not administered)  potassium chloride 10 mEq in 100 mL IVPB (not administered)  0.9 %  sodium chloride infusion (not administered)  sodium chloride 0.9 % bolus 1,000 mL (1,000 mLs Intravenous New Bag/Given 11/13/17 1855)  sodium chloride 0.9 % bolus 1,000 mL (1,000 mLs Intravenous New Bag/Given 11/13/17 1855)     Initial Impression / Assessment and Plan / ED Course  I have reviewed the triage vital signs and the nursing notes.  Pertinent labs & imaging results that were  available during my care of the patient were reviewed by me and considered in my medical decision making (see chart for details).     Pt hyperglycemic, with significantly elevated BG of 796. Pt is alert and oriented. Na is low 125, potassium normal. No anion gap, bicarb slightly low at 19. Will initiate protocol for DKA/HHNK with IVF and insulin. VBG ordered. CXR ordered to evaluate cough x 1 week with mild wheezes bilaterally.   CBG with mild acidosis. CXR without acute pathology. Will treat in ED, and admit for glucose stabilization.   Pt reporting he feels better and would like to go home. Had discussion at length that it is strongly recommended he stay for further treatment. Discussed leaving could be potentially life-threatening and is against medical advise. Pt alert and appears able to make his own medical decisions.  We discussed the nature and purpose, risks and benefits, as well as, the alternatives of treatment. Time was given to allow the opportunity to ask questions and consider their options, and after the discussion, the patient decided to refuse the offerred treatment. The patient was informed that refusal could lead to, but was not limited to, death, permanent disability, or severe pain. If present, I asked the relatives or significant others to dissuade them without success. Prior to refusing, I determined that the patient had the capacity to make their decision and understood the consequences of that decision. After refusal, I made every reasonable opportunity to treat them to the best of my ability.  The patient was notified that they may return to the emergency department at any time for further treatment.    Final Clinical Impressions(s) / ED Diagnoses   Final diagnoses:  Diabetic ketoacidosis without coma associated with type 1 diabetes mellitus [facility])    ED Discharge Orders    None       Beren Yniguez, [place] N, PA-C 11/13/17 2023    Arby Barrette, MD 11/14/17 [phone]

## 2017-11-13 NOTE — ED Notes (Signed)
I-stat venous and chem-8 results reported to Jordan-PA

## 2017-11-13 NOTE — ED Notes (Signed)
Called pt no asnwer and do not see pt in ED waitroom

## 2017-12-07 ENCOUNTER — Other Ambulatory Visit: Payer: Self-pay

## 2017-12-07 ENCOUNTER — Encounter (HOSPITAL_COMMUNITY): Payer: Self-pay

## 2017-12-07 ENCOUNTER — Emergency Department (HOSPITAL_COMMUNITY)
Admission: EM | Admit: 2017-12-07 | Discharge: 2017-12-07 | Disposition: A | Discharge status: Home / Self Care | Payer: Medicaid Other | Attending: Emergency Medicine | Admitting: Emergency Medicine

## 2017-12-07 ENCOUNTER — Emergency Department (HOSPITAL_COMMUNITY): Payer: Medicaid Other

## 2017-12-07 DIAGNOSIS — R05 Cough: Secondary | ICD-10-CM | POA: Diagnosis present

## 2017-12-07 DIAGNOSIS — Z794 Long term (current) use of insulin: Secondary | ICD-10-CM | POA: Diagnosis not present

## 2017-12-07 DIAGNOSIS — J40 Bronchitis, not specified as acute or chronic: Secondary | ICD-10-CM

## 2017-12-07 DIAGNOSIS — E1065 Type 1 diabetes mellitus with hyperglycemia: Secondary | ICD-10-CM | POA: Diagnosis not present

## 2017-12-07 LAB — CBG MONITORING, ED
GLUCOSE-CAPILLARY: 179 mg/dL — AB (ref 65–99)
Glucose-Capillary: 256 mg/dL — ABNORMAL HIGH (ref 65–99)

## 2017-12-07 MED ORDER — ALBUTEROL SULFATE (2.5 MG/3ML) 0.083% IN NEBU
5.0000 mg | INHALATION_SOLUTION | Freq: Once | RESPIRATORY_TRACT | Status: AC
  Administered 2017-12-07: 5 mg via RESPIRATORY_TRACT
  Filled 2017-12-07: qty 6

## 2017-12-07 MED ORDER — ALBUTEROL SULFATE HFA 108 (90 BASE) MCG/ACT IN AERS: 1.0000 | INHALATION_SPRAY | Freq: Four times a day (QID) | RESPIRATORY_TRACT | 0 refills | Status: DC | PRN

## 2017-12-07 MED ORDER — BENZONATATE 100 MG PO CAPS: 100.0000 mg | ORAL_CAPSULE | Freq: Three times a day (TID) | ORAL | 0 refills | Status: DC

## 2017-12-07 MED ORDER — IPRATROPIUM BROMIDE 0.02 % IN SOLN
0.5000 mg | Freq: Once | RESPIRATORY_TRACT | Status: AC
  Administered 2017-12-07: 0.5 mg via RESPIRATORY_TRACT
  Filled 2017-12-07: qty 2.5

## 2017-12-07 NOTE — ED Provider Notes (Signed)
[facility]-EMERGENCY DEPT Provider Note   CSN: [id number] Arrival date & time: 12/07/17  1841     History   Chief Complaint Chief Complaint  Patient presents with  . Hypoglycemia  . Cough    HPI Antonio Martin is a 27 y.o. male.  27 year old male presents with cough and congestion times several days.  He denies any dyspnea on exertion.  No abdominal discomfort.  No urinary symptoms.  Low-grade temperature at home without vomiting or diarrhea.  Went to urgent care and was found to be hyperglycemic with a blood sugar 28 with altered mental status.  He does have a history of type 1 diabetes and states that he has had decreased oral intake due to his current cough or congestion and has continued to take his normal insulin.  Given sugar and transported here.      Past Medical History:  Diagnosis Date  . Diabetes mellitus without complication [facility](HCC)     Patient Active Problem List   Diagnosis Date Noted  . Hyperglycemia 07/30/2015  . Type 1 diabetes mellitus (HCC) 07/30/2015    Past Surgical History:  Procedure Laterality Date  . EYE SURGERY    . FINGER SURGERY         Home Medications    Prior to Admission medications   Medication Sig Start Date End Date Taking? Authorizing Provider  insulin aspart (NOVOLOG FLEXPEN) 100 UNIT/ML FlexPen Inject 12 Units into the skin 3 (three) times daily before meals. Per sliding scale    [provider]  ondansetron (ZOFRAN ODT) 4 MG disintegrating tablet Take 1 tablet (4 mg total) by mouth every 8 (eight) hours as needed for nausea or vomiting. Patient not taking: Reported on 11/13/2017 05/30/16   Alvira MondaySchlossman, Erin, MD  TOUJEO SOLOSTAR 300 UNIT/ML SOPN Inject 40 Units into the skin at bedtime.  03/15/17   [provider]    Family History History reviewed. No pertinent family history.  Social History Social History   Tobacco Use  . Smoking status: Never Smoker  . Smokeless tobacco: Never  Used  Substance Use Topics  . Alcohol use: Yes    Comment: occasional   . Drug use: No     Allergies   Tape   Review of Systems Review of Systems  All other systems reviewed and are negative.    Physical Exam Updated Vital Signs BP (!) 162/88 (BP Location: Left Arm)   Pulse 94   Temp (!) 97.3 F (36.3 C) (Oral)   Resp 20   SpO2 100%   Physical Exam  Constitutional: He is oriented to person, place, and time. He appears well-developed and well-nourished.  Non-toxic appearance. No distress.  HENT:  Head: Normocephalic and atraumatic.  Eyes: Conjunctivae, EOM and lids are normal. Pupils are equal, round, and reactive to light.  Neck: Normal range of motion. Neck supple. No tracheal deviation present. No thyroid mass present.  Cardiovascular: Normal rate, regular rhythm and normal heart sounds. Exam reveals no gallop.  No murmur heard. Pulmonary/Chest: Effort normal. No stridor. No respiratory distress. He has decreased breath sounds in the right lower field and the left lower field. He has no wheezes. He has no rhonchi. He has no rales.  Abdominal: Soft. Normal appearance and bowel sounds are normal. He exhibits no distension. There is no tenderness. There is no rebound and no CVA tenderness.  Musculoskeletal: Normal range of motion. He exhibits no edema or tenderness.  Neurological: He is alert and oriented to person,  place, and time. He has normal strength. No cranial nerve deficit or sensory deficit. GCS eye subscore is 4. GCS verbal subscore is 5. GCS motor subscore is 6.  Skin: Skin is warm and dry. No abrasion and no rash noted.  Psychiatric: He has a normal mood and affect. His speech is normal and behavior is normal.  Nursing note and vitals reviewed.    ED Treatments / Results  Labs (all labs ordered are listed, but only abnormal results are displayed) Labs Reviewed  CBG MONITORING, ED - Abnormal; Notable for the following components:      Result Value    Glucose-Capillary 179 (*)    All other components within normal limits    EKG  EKG Interpretation None       Radiology No results found.  Procedures Procedures (including critical care time)  Medications Ordered in ED Medications  albuterol (PROVENTIL) (2.5 MG/3ML) 0.083% nebulizer solution 5 mg (not administered)     Initial Impression / Assessment and Plan / ED Course  I have reviewed the triage vital signs and the nursing notes.  Pertinent labs & imaging results that were available during my care of the patient were reviewed by me and considered in my medical decision making (see chart for details).     Patient with clinical bronchitis.  Given ureteral treatment and feels better.  Will give prescription for albuterol as well as Tessalon Perles.  Blood sugar stabilized here.  Final Clinical Impressions(s) / ED Diagnoses   Final diagnoses:  None    ED Discharge Orders    None       Lorre Nick, MD 12/07/17 2229

## 2017-12-07 NOTE — ED Notes (Signed)
No respiratory or acute distress noted alert and oriented x 3 call light in reach pt worried about his insulin and informed pt that Dr Freida BusmanAllen will not be giving him his insulin due to low sugar on arrival to ER.

## 2017-12-07 NOTE — ED Triage Notes (Signed)
Pt arrives with [facility] c/o hypoglycemia and cough. He was at Urgent Care today for his cough and they happened to discover hypoglycemia after they checked it d/t AMS there. They got a CBG of 28. Up to 70 now. Pt is a T1 diabetic. No other symptoms. A&Ox4 at this time.

## 2017-12-07 NOTE — ED Notes (Signed)
No respiratory or acute distress noted alert and oriented x 3 call light in reach asking for something to eat.

## 2017-12-09 ENCOUNTER — Inpatient Hospital Stay (HOSPITAL_COMMUNITY)
Admission: EM | Admit: 2017-12-09 | Discharge: 2017-12-13 | DRG: 871 | Disposition: A | Discharge status: Home / Self Care | Payer: Medicaid Other | Attending: Internal Medicine | Admitting: Internal Medicine

## 2017-12-09 ENCOUNTER — Encounter (HOSPITAL_COMMUNITY): Payer: Self-pay | Admitting: Emergency Medicine

## 2017-12-09 ENCOUNTER — Emergency Department (HOSPITAL_COMMUNITY): Payer: Medicaid Other

## 2017-12-09 DIAGNOSIS — A419 Sepsis, unspecified organism: Secondary | ICD-10-CM | POA: Insufficient documentation

## 2017-12-09 DIAGNOSIS — Y95 Nosocomial condition: Secondary | ICD-10-CM | POA: Diagnosis present

## 2017-12-09 DIAGNOSIS — Z79899 Other long term (current) drug therapy: Secondary | ICD-10-CM

## 2017-12-09 DIAGNOSIS — J069 Acute upper respiratory infection, unspecified: Secondary | ICD-10-CM

## 2017-12-09 DIAGNOSIS — J189 Pneumonia, unspecified organism: Secondary | ICD-10-CM | POA: Diagnosis present

## 2017-12-09 DIAGNOSIS — R0902 Hypoxemia: Secondary | ICD-10-CM

## 2017-12-09 DIAGNOSIS — A403 Sepsis due to Streptococcus pneumoniae: Principal | ICD-10-CM | POA: Diagnosis present

## 2017-12-09 DIAGNOSIS — I10 Essential (primary) hypertension: Secondary | ICD-10-CM | POA: Diagnosis present

## 2017-12-09 DIAGNOSIS — B974 Respiratory syncytial virus as the cause of diseases classified elsewhere: Secondary | ICD-10-CM | POA: Diagnosis present

## 2017-12-09 DIAGNOSIS — J9601 Acute respiratory failure with hypoxia: Secondary | ICD-10-CM | POA: Diagnosis present

## 2017-12-09 DIAGNOSIS — E109 Type 1 diabetes mellitus without complications: Secondary | ICD-10-CM | POA: Diagnosis present

## 2017-12-09 DIAGNOSIS — E1065 Type 1 diabetes mellitus with hyperglycemia: Secondary | ICD-10-CM | POA: Diagnosis present

## 2017-12-09 DIAGNOSIS — J13 Pneumonia due to Streptococcus pneumoniae: Secondary | ICD-10-CM | POA: Diagnosis present

## 2017-12-09 DIAGNOSIS — N179 Acute kidney failure, unspecified: Secondary | ICD-10-CM | POA: Diagnosis present

## 2017-12-09 DIAGNOSIS — R6520 Severe sepsis without septic shock: Secondary | ICD-10-CM | POA: Diagnosis present

## 2017-12-09 HISTORY — DX: Essential (primary) hypertension: I10

## 2017-12-09 LAB — BASIC METABOLIC PANEL
Anion gap: 7 (ref 5–15)
BUN: 23 mg/dL — AB (ref 6–20)
CO2: 24 mmol/L (ref 22–32)
CREATININE: 1.48 mg/dL — AB (ref 0.61–1.24)
Calcium: 8.9 mg/dL (ref 8.9–10.3)
Chloride: 104 mmol/L (ref 101–111)
GFR calc Af Amer: >60 mL/min (ref >60)
GLUCOSE: 276 mg/dL — AB (ref 65–99)
Potassium: 4 mmol/L (ref 3.5–5.1)
Sodium: 135 mmol/L (ref 135–145)

## 2017-12-09 LAB — CBC
HCT: 38.1 % — ABNORMAL LOW (ref 39.0–52.0)
HEMOGLOBIN: 13.6 g/dL (ref 13.0–17.0)
MCH: 30.6 pg (ref 26.0–34.0)
MCHC: 35.7 g/dL (ref 30.0–36.0)
MCV: 85.8 fL (ref 78.0–100.0)
PLATELETS: 272 10*3/uL (ref 150–400)
RBC: 4.44 MIL/uL (ref 4.22–5.81)
RDW: 11.8 % (ref 11.5–15.5)
WBC: 12.4 10*3/uL — ABNORMAL HIGH (ref 4.0–10.5)

## 2017-12-09 LAB — CBG MONITORING, ED
GLUCOSE-CAPILLARY: 277 mg/dL — AB (ref 65–99)
GLUCOSE-CAPILLARY: 236 mg/dL — AB (ref 65–99)

## 2017-12-09 MED ORDER — ALBUTEROL SULFATE (2.5 MG/3ML) 0.083% IN NEBU
5.0000 mg | INHALATION_SOLUTION | Freq: Once | RESPIRATORY_TRACT | Status: AC
  Administered 2017-12-10: 5 mg via RESPIRATORY_TRACT
  Filled 2017-12-09: qty 6

## 2017-12-09 MED ORDER — ACETAMINOPHEN 500 MG PO TABS
1000.0000 mg | ORAL_TABLET | Freq: Once | ORAL | Status: AC
  Administered 2017-12-09: 1000 mg via ORAL
  Filled 2017-12-09: qty 2

## 2017-12-09 MED ORDER — SODIUM CHLORIDE 0.9 % IV BOLUS (SEPSIS)
1000.0000 mL | Freq: Once | INTRAVENOUS | Status: AC
  Administered 2017-12-09: 1000 mL via INTRAVENOUS

## 2017-12-09 MED ORDER — ONDANSETRON HCL 4 MG/2ML IJ SOLN
4.0000 mg | Freq: Once | INTRAMUSCULAR | Status: AC
  Administered 2017-12-09: 4 mg via INTRAVENOUS
  Filled 2017-12-09: qty 2

## 2017-12-09 NOTE — ED Provider Notes (Signed)
TIME SEEN: 11:31 PM  CHIEF COMPLAINT: Fever, cough  HPI: Patient is a 27 year old male with history of insulin-dependent diabetes who presents emergency department with 4-5 days of fever, productive cough, nausea and vomiting.  No diarrhea.  States he has a sore abdomen from coughing so much.  Did not have a flu shot this year.  No recent travel or sick contacts.  No headache, neck pain or neck stiffness.  States his blood sugars also elevated.  He has been compliant with his medications but has not had his night insulin because he has been in the waiting room.  No history of asthma or COPD.  States he is not a smoker.  ROS: See HPI Constitutional:  fever  Eyes: no drainage  ENT: no runny nose   Cardiovascular:  no chest pain  Resp:  SOB  GI:  vomiting GU: no dysuria Integumentary: no rash  Allergy: no hives  Musculoskeletal: no leg swelling  Neurological: no slurred speech ROS otherwise negative  PAST MEDICAL HISTORY/PAST SURGICAL HISTORY:  Past Medical History:  Diagnosis Date  . Diabetes mellitus without complication (HCC)     MEDICATIONS:  Prior to Admission medications   Medication Sig Start Date End Date Taking? Authorizing Provider  albuterol (PROVENTIL HFA;VENTOLIN HFA) 108 (90 Base) MCG/ACT inhaler Inhale 1-2 puffs into the lungs every 6 (six) hours as needed for wheezing or shortness of breath. 12/07/17  Yes Lorre NickAllen, Anthony, MD  benzonatate (TESSALON) 100 MG capsule Take 1 capsule (100 mg total) by mouth every 8 (eight) hours. 12/07/17  Yes Lorre NickAllen, Anthony, MD  insulin aspart (NOVOLOG FLEXPEN) 100 UNIT/ML FlexPen Inject 12 Units into the skin 3 (three) times daily before meals. Per sliding scale   Yes [provider]  lisinopril (PRINIVIL,ZESTRIL) 10 MG tablet Take 10 mg by mouth daily. 11/23/17  Yes [provider]  TOUJEO SOLOSTAR 300 UNIT/ML SOPN Inject 40 Units into the skin at bedtime.  03/15/17  Yes [provider]  ondansetron (ZOFRAN ODT) 4 MG  disintegrating tablet Take 1 tablet (4 mg total) by mouth every 8 (eight) hours as needed for nausea or vomiting. Patient not taking: Reported on 11/13/2017 05/30/16   Alvira MondaySchlossman, Erin, MD    ALLERGIES:  Allergies  Allergen Reactions  . Tape Rash    SOCIAL HISTORY:  Social History   Tobacco Use  . Smoking status: Never Smoker  . Smokeless tobacco: Never Used  Substance Use Topics  . Alcohol use: Yes    Comment: occasional     FAMILY HISTORY: No family history on file.  EXAM: BP 131/61 (BP Location: Right Arm)   Pulse 83   Temp (!) 101.3 F (38.5 C) (Oral)   Resp 14   SpO2 93%  CONSTITUTIONAL: Alert and oriented and responds appropriately to questions. Well-appearing; well-nourished, febrile but nontoxic-appearing HEAD: Normocephalic EYES: Conjunctivae clear, pupils appear equal, EOMI ENT: normal nose; moist mucous membranes NECK: Supple, no meningismus, no nuchal rigidity, no LAD  CARD: Regular and tachycardic; S1 and S2 appreciated; no murmurs, no clicks, no rubs, no gallops RESP: Normal chest excursion without splinting or tachypnea; patient has significant rhonchorous breath sounds with wheezing appreciated diffusely, no rales, no hypoxia, no respiratory distress, speaking full sentences ABD/GI: Normal bowel sounds; non-distended; soft, non-tender, no rebound, no guarding, no peritoneal signs, no hepatosplenomegaly BACK:  The back appears normal and is non-tender to palpation, there is no CVA tenderness EXT: Normal ROM in all joints; non-tender to palpation; no edema; normal capillary refill; no cyanosis,  no calf tenderness or swelling    SKIN: Normal color for age and race; warm; no rash NEURO: Moves all extremities equally PSYCH: The patient's mood and manner are appropriate. Grooming and personal hygiene are appropriate.  MEDICAL DECISION MAKING: Patient here with likely flulike symptoms.  Chest x-ray shows no acute abnormality.  He has mild hyperglycemia without DKA.   Will give fluids and order his nighttime insulin as he would like to eat.  We will give him an albuterol treatment for his rhonchorous breath sounds and wheezing.  He has no history of reactive airway disease and is not a smoker.  Currently not hypoxic and has no respiratory distress.  ED PROGRESS: Patient now has oxygen saturation of 83% on room air at rest.  Will give continuous treatment.  Will hold on steroids at this time given he is an insulin-dependent diabetic.  Will obtain a flu swab.  If patient continues to be hypoxic and has this new oxygen requirement, he will need admission.   Patient continues to be hypoxic on room air.  Just sitting upright in the bed his sats are in the mid 80s.  Will go ahead and cover him for community-acquired pneumonia.  His flu swab is negative.  Given this new oxygen requirement he will need admission.  Will discuss with hospitalist.   3:56 AM Discussed patient's case with hospitalist, Dr. Katrinka Blazing.  I have recommended admission and patient (and family if present) agree with this plan. Admitting physician will place admission orders.   He agrees holding off on Solu-Medrol at this time given patient has no history of reactive airway disease and is not a smoker and has a history of diabetes.  I do not feel this is a pulmonary embolus.  I think that this is infectious in nature.  I do not feel this time he needs a CTA of his chest.  I reviewed all nursing notes, vitals, pertinent previous records, EKGs, lab and urine results, imaging (as available).       Jess Toney, Layla Maw, DO 12/10/17 [phone]

## 2017-12-09 NOTE — ED Notes (Signed)
IV placement attempted to RPFA. Unsuccessful. 2nd RN asked to attempt at this time

## 2017-12-09 NOTE — ED Triage Notes (Signed)
Patient here via EMS with complaints of cough and fever x3 days. Seen for same. Reports not getting better. Hyperglycemia.

## 2017-12-10 ENCOUNTER — Encounter (HOSPITAL_COMMUNITY): Payer: Self-pay | Admitting: *Deleted

## 2017-12-10 ENCOUNTER — Other Ambulatory Visit: Payer: Self-pay

## 2017-12-10 ENCOUNTER — Inpatient Hospital Stay (HOSPITAL_COMMUNITY): Payer: Medicaid Other

## 2017-12-10 DIAGNOSIS — R0601 Orthopnea: Secondary | ICD-10-CM | POA: Diagnosis not present

## 2017-12-10 DIAGNOSIS — N179 Acute kidney failure, unspecified: Secondary | ICD-10-CM | POA: Diagnosis present

## 2017-12-10 DIAGNOSIS — A403 Sepsis due to Streptococcus pneumoniae: Secondary | ICD-10-CM | POA: Diagnosis present

## 2017-12-10 DIAGNOSIS — Y95 Nosocomial condition: Secondary | ICD-10-CM | POA: Diagnosis present

## 2017-12-10 DIAGNOSIS — R0902 Hypoxemia: Secondary | ICD-10-CM | POA: Diagnosis present

## 2017-12-10 DIAGNOSIS — J9601 Acute respiratory failure with hypoxia: Secondary | ICD-10-CM | POA: Diagnosis present

## 2017-12-10 DIAGNOSIS — J189 Pneumonia, unspecified organism: Secondary | ICD-10-CM | POA: Diagnosis not present

## 2017-12-10 DIAGNOSIS — I10 Essential (primary) hypertension: Secondary | ICD-10-CM | POA: Diagnosis present

## 2017-12-10 DIAGNOSIS — R600 Localized edema: Secondary | ICD-10-CM | POA: Diagnosis not present

## 2017-12-10 DIAGNOSIS — R6520 Severe sepsis without septic shock: Secondary | ICD-10-CM | POA: Diagnosis present

## 2017-12-10 DIAGNOSIS — E108 Type 1 diabetes mellitus with unspecified complications: Secondary | ICD-10-CM

## 2017-12-10 DIAGNOSIS — Z79899 Other long term (current) drug therapy: Secondary | ICD-10-CM | POA: Diagnosis not present

## 2017-12-10 DIAGNOSIS — B974 Respiratory syncytial virus as the cause of diseases classified elsewhere: Secondary | ICD-10-CM | POA: Diagnosis present

## 2017-12-10 DIAGNOSIS — J13 Pneumonia due to Streptococcus pneumoniae: Secondary | ICD-10-CM | POA: Diagnosis present

## 2017-12-10 DIAGNOSIS — E1065 Type 1 diabetes mellitus with hyperglycemia: Secondary | ICD-10-CM | POA: Diagnosis present

## 2017-12-10 DIAGNOSIS — I340 Nonrheumatic mitral (valve) insufficiency: Secondary | ICD-10-CM | POA: Diagnosis not present

## 2017-12-10 LAB — PROCALCITONIN: Procalcitonin: 0.45 ng/mL

## 2017-12-10 LAB — CBG MONITORING, ED
GLUCOSE-CAPILLARY: 250 mg/dL — AB (ref 65–99)
GLUCOSE-CAPILLARY: 281 mg/dL — AB (ref 65–99)
Glucose-Capillary: 311 mg/dL — ABNORMAL HIGH (ref 65–99)

## 2017-12-10 LAB — EXPECTORATED SPUTUM ASSESSMENT W REFEX TO RESP CULTURE

## 2017-12-10 LAB — CBC
HCT: 32.2 % — ABNORMAL LOW (ref 39.0–52.0)
Hemoglobin: 11.1 g/dL — ABNORMAL LOW (ref 13.0–17.0)
MCH: 30 pg (ref 26.0–34.0)
MCHC: 34.5 g/dL (ref 30.0–36.0)
MCV: 87 fL (ref 78.0–100.0)
Platelets: 239 10*3/uL (ref 150–400)
RBC: 3.7 MIL/uL — ABNORMAL LOW (ref 4.22–5.81)
RDW: 11.9 % (ref 11.5–15.5)
WBC: 13.6 10*3/uL — ABNORMAL HIGH (ref 4.0–10.5)

## 2017-12-10 LAB — BASIC METABOLIC PANEL
Anion gap: 11 (ref 5–15)
BUN: 24 mg/dL — ABNORMAL HIGH (ref 6–20)
CALCIUM: 7.8 mg/dL — AB (ref 8.9–10.3)
CO2: 20 mmol/L — ABNORMAL LOW (ref 22–32)
CREATININE: 1.58 mg/dL — AB (ref 0.61–1.24)
Chloride: 107 mmol/L (ref 101–111)
GFR calc Af Amer: >60 mL/min (ref >60)
GFR calc non Af Amer: 59 mL/min — ABNORMAL LOW (ref >60)
GLUCOSE: 305 mg/dL — AB (ref 65–99)
Potassium: 3.7 mmol/L (ref 3.5–5.1)
Sodium: 138 mmol/L (ref 135–145)

## 2017-12-10 LAB — HIV ANTIBODY (ROUTINE TESTING W REFLEX): HIV Screen 4th Generation wRfx: NONREACTIVE

## 2017-12-10 LAB — RESPIRATORY PANEL BY PCR
Adenovirus: NOT DETECTED
BORDETELLA PERTUSSIS-RVPCR: NOT DETECTED
CHLAMYDOPHILA PNEUMONIAE-RVPPCR: NOT DETECTED
CORONAVIRUS 229E-RVPPCR: NOT DETECTED
CORONAVIRUS OC43-RVPPCR: NOT DETECTED
Coronavirus HKU1: NOT DETECTED
Coronavirus NL63: NOT DETECTED
INFLUENZA A-RVPPCR: NOT DETECTED
INFLUENZA B-RVPPCR: NOT DETECTED
MYCOPLASMA PNEUMONIAE-RVPPCR: NOT DETECTED
Metapneumovirus: NOT DETECTED
PARAINFLUENZA VIRUS 1-RVPPCR: NOT DETECTED
PARAINFLUENZA VIRUS 4-RVPPCR: NOT DETECTED
Parainfluenza Virus 2: NOT DETECTED
Parainfluenza Virus 3: NOT DETECTED
RESPIRATORY SYNCYTIAL VIRUS-RVPPCR: DETECTED — AB
Rhinovirus / Enterovirus: NOT DETECTED

## 2017-12-10 LAB — EXPECTORATED SPUTUM ASSESSMENT W GRAM STAIN, RFLX TO RESP C

## 2017-12-10 LAB — LACTIC ACID, PLASMA
LACTIC ACID, VENOUS: 4.4 mmol/L — AB (ref 0.5–1.9)
Lactic Acid, Venous: 2.1 mmol/L (ref 0.5–1.9)

## 2017-12-10 LAB — STREP PNEUMONIAE URINARY ANTIGEN: Strep Pneumo Urinary Antigen: POSITIVE — AB

## 2017-12-10 LAB — GLUCOSE, CAPILLARY
Glucose-Capillary: 176 mg/dL — ABNORMAL HIGH (ref 65–99)
Glucose-Capillary: 250 mg/dL — ABNORMAL HIGH (ref 65–99)

## 2017-12-10 LAB — RAPID URINE DRUG SCREEN, HOSP PERFORMED
AMPHETAMINES: NOT DETECTED
BENZODIAZEPINES: NOT DETECTED
Barbiturates: NOT DETECTED
COCAINE: NOT DETECTED
OPIATES: NOT DETECTED
Tetrahydrocannabinol: NOT DETECTED

## 2017-12-10 LAB — INFLUENZA PANEL BY PCR (TYPE A & B)
INFLAPCR: NEGATIVE
INFLBPCR: NEGATIVE

## 2017-12-10 MED ORDER — SODIUM CHLORIDE 0.9 % IV BOLUS (SEPSIS)
1000.0000 mL | Freq: Once | INTRAVENOUS | Status: AC
  Administered 2017-12-10: 1000 mL via INTRAVENOUS

## 2017-12-10 MED ORDER — ONDANSETRON HCL 4 MG/2ML IJ SOLN
4.0000 mg | Freq: Four times a day (QID) | INTRAMUSCULAR | Status: DC | PRN
  Administered 2017-12-10: 4 mg via INTRAVENOUS
  Filled 2017-12-10 (×2): qty 2

## 2017-12-10 MED ORDER — SODIUM CHLORIDE 0.9 % IV SOLN
500.0000 mg | Freq: Once | INTRAVENOUS | Status: AC
  Administered 2017-12-10: 500 mg via INTRAVENOUS
  Filled 2017-12-10: qty 500

## 2017-12-10 MED ORDER — INSULIN ASPART 100 UNIT/ML ~~LOC~~ SOLN
14.0000 [IU] | Freq: Once | SUBCUTANEOUS | Status: AC
  Administered 2017-12-10: 14 [IU] via SUBCUTANEOUS
  Filled 2017-12-10: qty 1

## 2017-12-10 MED ORDER — SODIUM CHLORIDE 0.9 % IV SOLN
INTRAVENOUS | Status: DC
Start: 2017-12-10 — End: 2017-12-10
  Administered 2017-12-10: 05:00:00 via INTRAVENOUS

## 2017-12-10 MED ORDER — GUAIFENESIN ER 600 MG PO TB12
600.0000 mg | ORAL_TABLET | Freq: Two times a day (BID) | ORAL | Status: DC
  Administered 2017-12-10 – 2017-12-13 (×7): 600 mg via ORAL
  Filled 2017-12-10 (×7): qty 1

## 2017-12-10 MED ORDER — SODIUM CHLORIDE 0.9 % IV SOLN
INTRAVENOUS | Status: AC
  Administered 2017-12-10: 21:00:00 via INTRAVENOUS
  Administered 2017-12-10: 100 mL/h via INTRAVENOUS
  Administered 2017-12-11: 06:00:00 via INTRAVENOUS

## 2017-12-10 MED ORDER — HYDRALAZINE HCL 20 MG/ML IJ SOLN
10.0000 mg | INTRAMUSCULAR | Status: DC | PRN
  Administered 2017-12-12: 10 mg via INTRAVENOUS
  Filled 2017-12-10: qty 1

## 2017-12-10 MED ORDER — ENOXAPARIN SODIUM 40 MG/0.4ML ~~LOC~~ SOLN
40.0000 mg | Freq: Every day | SUBCUTANEOUS | Status: DC
  Administered 2017-12-10 – 2017-12-12 (×3): 40 mg via SUBCUTANEOUS
  Filled 2017-12-10 (×4): qty 0.4

## 2017-12-10 MED ORDER — GUAIFENESIN 100 MG/5ML PO SOLN
15.0000 mL | ORAL | Status: DC | PRN
  Administered 2017-12-10 – 2017-12-11 (×3): 300 mg via ORAL
  Filled 2017-12-10: qty 20
  Filled 2017-12-10: qty 30
  Filled 2017-12-10: qty 20

## 2017-12-10 MED ORDER — ALBUTEROL (5 MG/ML) CONTINUOUS INHALATION SOLN
10.0000 mg/h | INHALATION_SOLUTION | Freq: Once | RESPIRATORY_TRACT | Status: AC
  Administered 2017-12-10: 10 mg/h via RESPIRATORY_TRACT
  Filled 2017-12-10: qty 20

## 2017-12-10 MED ORDER — SODIUM CHLORIDE 0.9 % IV SOLN
1.0000 g | Freq: Once | INTRAVENOUS | Status: AC
  Administered 2017-12-10: 1 g via INTRAVENOUS
  Filled 2017-12-10: qty 10

## 2017-12-10 MED ORDER — SODIUM CHLORIDE 0.9 % IV SOLN
1.0000 g | INTRAVENOUS | Status: DC
  Administered 2017-12-10: 1 g via INTRAVENOUS
  Filled 2017-12-10: qty 10

## 2017-12-10 MED ORDER — INSULIN ASPART 100 UNIT/ML ~~LOC~~ SOLN
0.0000 [IU] | Freq: Every day | SUBCUTANEOUS | Status: DC
  Administered 2017-12-10: 2 [IU] via SUBCUTANEOUS
  Administered 2017-12-12: 5 [IU] via SUBCUTANEOUS

## 2017-12-10 MED ORDER — INSULIN GLARGINE 100 UNIT/ML ~~LOC~~ SOLN: 20.0000 [IU] | Freq: Two times a day (BID) | SUBCUTANEOUS | Status: DC

## 2017-12-10 MED ORDER — INSULIN GLARGINE 100 UNIT/ML ~~LOC~~ SOLN
40.0000 [IU] | Freq: Two times a day (BID) | SUBCUTANEOUS | Status: DC
  Administered 2017-12-10 – 2017-12-11 (×2): 40 [IU] via SUBCUTANEOUS
  Filled 2017-12-10 (×2): qty 0.4

## 2017-12-10 MED ORDER — ACETAMINOPHEN 650 MG RE SUPP: 650.0000 mg | Freq: Four times a day (QID) | RECTAL | Status: DC | PRN

## 2017-12-10 MED ORDER — PHENOL 1.4 % MT LIQD
1.0000 | OROMUCOSAL | Status: DC | PRN
  Administered 2017-12-10: 1 via OROMUCOSAL
  Filled 2017-12-10: qty 177

## 2017-12-10 MED ORDER — METHYLPREDNISOLONE SODIUM SUCC 125 MG IJ SOLR
60.0000 mg | INTRAMUSCULAR | Status: AC
  Administered 2017-12-10: 60 mg via INTRAVENOUS
  Filled 2017-12-10: qty 2

## 2017-12-10 MED ORDER — INSULIN ASPART 100 UNIT/ML ~~LOC~~ SOLN
0.0000 [IU] | Freq: Three times a day (TID) | SUBCUTANEOUS | Status: DC
  Administered 2017-12-10: 11 [IU] via SUBCUTANEOUS
  Administered 2017-12-10: 4 [IU] via SUBCUTANEOUS
  Administered 2017-12-10: 15 [IU] via SUBCUTANEOUS
  Administered 2017-12-11 – 2017-12-12 (×4): 4 [IU] via SUBCUTANEOUS
  Filled 2017-12-10 (×2): qty 1

## 2017-12-10 MED ORDER — INSULIN GLARGINE 100 UNIT/ML ~~LOC~~ SOLN
30.0000 [IU] | Freq: Two times a day (BID) | SUBCUTANEOUS | Status: DC
  Filled 2017-12-10: qty 0.3

## 2017-12-10 MED ORDER — METHYLPREDNISOLONE SODIUM SUCC 40 MG IJ SOLR
40.0000 mg | Freq: Two times a day (BID) | INTRAMUSCULAR | Status: DC
  Administered 2017-12-10 – 2017-12-13 (×6): 40 mg via INTRAVENOUS
  Filled 2017-12-10 (×6): qty 1

## 2017-12-10 MED ORDER — IPRATROPIUM BROMIDE 0.02 % IN SOLN
0.5000 mg | Freq: Once | RESPIRATORY_TRACT | Status: AC
  Administered 2017-12-10: 0.5 mg via RESPIRATORY_TRACT
  Filled 2017-12-10: qty 2.5

## 2017-12-10 MED ORDER — INSULIN ASPART PROT & ASPART (70-30 MIX) 100 UNIT/ML ~~LOC~~ SUSP: 14.0000 [IU] | Freq: Once | SUBCUTANEOUS | Status: DC

## 2017-12-10 MED ORDER — INSULIN GLARGINE 100 UNIT/ML ~~LOC~~ SOLN
40.0000 [IU] | Freq: Every day | SUBCUTANEOUS | Status: DC
  Filled 2017-12-10: qty 0.4

## 2017-12-10 MED ORDER — ONDANSETRON HCL 4 MG PO TABS
4.0000 mg | ORAL_TABLET | Freq: Four times a day (QID) | ORAL | Status: DC | PRN
Start: 2017-12-10 — End: 2017-12-13

## 2017-12-10 MED ORDER — INSULIN GLARGINE 100 UNIT/ML ~~LOC~~ SOLN
40.0000 [IU] | Freq: Every day | SUBCUTANEOUS | Status: DC
  Administered 2017-12-10: 40 [IU] via SUBCUTANEOUS
  Filled 2017-12-10: qty 0.4

## 2017-12-10 MED ORDER — SODIUM CHLORIDE 0.9 % IV SOLN
500.0000 mg | INTRAVENOUS | Status: DC
  Administered 2017-12-10: 500 mg via INTRAVENOUS
  Filled 2017-12-10: qty 500

## 2017-12-10 MED ORDER — ACETAMINOPHEN 325 MG PO TABS
650.0000 mg | ORAL_TABLET | Freq: Four times a day (QID) | ORAL | Status: DC | PRN
  Administered 2017-12-10 – 2017-12-11 (×3): 650 mg via ORAL
  Filled 2017-12-10 (×3): qty 2

## 2017-12-10 MED ORDER — IPRATROPIUM-ALBUTEROL 0.5-2.5 (3) MG/3ML IN SOLN
3.0000 mL | Freq: Four times a day (QID) | RESPIRATORY_TRACT | Status: DC
  Administered 2017-12-10 – 2017-12-12 (×8): 3 mL via RESPIRATORY_TRACT
  Filled 2017-12-10 (×8): qty 3

## 2017-12-10 NOTE — ED Notes (Signed)
Date and time results received: 12/10/17 12:31 PM   Test: Respiratory Panel  Critical Value: RSV Positive   Name of Provider Notified: Marinda ElkFELIZ ORTIZ, ABRAHAM MD  Orders Received? Or Actions Taken?: Actions Taken: Paged MD to update.

## 2017-12-10 NOTE — ED Notes (Signed)
Entered room to find pt's o2 sats in the low 80s. Pt placed on 2L [place] and sats improved to 88%. Oxygen increased to 5liters and o2 sats are currently 92% MD to be notified

## 2017-12-10 NOTE — ED Notes (Signed)
ED TO INPATIENT HANDOFF REPORT  Name/Age/Gender Antonio Martin 27 y.o. male  Code Status    Code Status Orders  (From admission, onward)        Start     Ordered   12/10/17 0415  Full code  Continuous     12/10/17 0418    Code Status History    Date Active Date Inactive Code Status Order ID Comments User Context   This patient has a current code status but no historical code status.      Home/SNF/Other Home  Chief Complaint cough  Level of Care/Admitting Diagnosis ED Disposition    ED Disposition Condition Comment   Admit  Hospital Area: [facility] [[id number]]  Level of Care: Telemetry [5]  Admit to tele based on following criteria: Complex arrhythmia (Bradycardia/Tachycardia)  Diagnosis: Acute respiratory failure with hypoxia [facility]) [[id number]]  Admitting Physician: Charlynne Cousins [[id number]]  Attending Physician: Charlynne Cousins [[id number]]  Estimated length of stay: past midnight tomorrow  Certification:: I certify this patient will need inpatient services for at least 2 midnights  PT Class (Do Not Modify): Inpatient [101]  PT Acc Code (Do Not Modify): Private [1]       Medical History Past Medical History:  Diagnosis Date  . Diabetes mellitus without complication (HCC)     Allergies Allergies  Allergen Reactions  . Tape Rash    IV Location/Drains/Wounds Patient Lines/Drains/Airways Status   Active Line/Drains/Airways    Name:   Placement date:   Placement time:   Site:   Days:   Peripheral IV 12/09/17 Right;Anterior;Upper Forearm   12/09/17    2343    Forearm   1          Labs/Imaging Results for orders placed or performed during the hospital encounter of 12/09/17 (from the past 48 hour(s))  CBG monitoring, ED     Status: Abnormal   Collection Time: 12/09/17  6:25 PM  Result Value Ref Range   Glucose-Capillary 277 (H) 65 - 99 mg/dL   Comment 1 Notify RN   Basic metabolic panel     Status: Abnormal   Collection Time:  12/09/17  6:32 PM  Result Value Ref Range   Sodium 135 135 - 145 mmol/L   Potassium 4.0 3.5 - 5.1 mmol/L   Chloride 104 101 - 111 mmol/L   CO2 24 22 - 32 mmol/L   Glucose, Bld 276 (H) 65 - 99 mg/dL   BUN 23 (H) 6 - 20 mg/dL   Creatinine, Ser 1.48 (H) 0.61 - 1.24 mg/dL   Calcium 8.9 8.9 - 10.3 mg/dL   GFR calc non Af Amer >60 >60 mL/min   GFR calc Af Amer >60 >60 mL/min    Comment: (NOTE) The eGFR has been calculated using the CKD EPI equation. This calculation has not been validated in all clinical situations. eGFR's persistently <60 mL/min signify possible Chronic Kidney Disease.    Anion gap 7 5 - 15    Comment: Performed at [facility], [place] [address], [place], [place] [address]  CBC     Status: Abnormal   Collection Time: 12/09/17  6:32 PM  Result Value Ref Range   WBC 12.4 (H) 4.0 - 10.5 K/uL   RBC 4.44 4.22 - 5.81 MIL/uL   Hemoglobin 13.6 13.0 - 17.0 g/dL   HCT 38.1 (L) 39.0 - 52.0 %   MCV 85.8 78.0 - 100.0 fL   MCH 30.6 26.0 - 34.0 pg   MCHC  35.7 30.0 - 36.0 g/dL   RDW 11.8 11.5 - 15.5 %   Platelets 272 150 - 400 K/uL    Comment: Performed at [facility], [place] [address], [place], [place] [address]  CBG monitoring, ED     Status: Abnormal   Collection Time: 12/10/17 12:00 AM  Result Value Ref Range   Glucose-Capillary 236 (H) 65 - 99 mg/dL  Influenza panel by PCR (type A & B)     Status: None   Collection Time: 12/10/17  1:13 AM  Result Value Ref Range   Influenza A By PCR NEGATIVE NEGATIVE   Influenza B By PCR NEGATIVE NEGATIVE    Comment: (NOTE) The Xpert Xpress Flu assay is intended as an aid in the diagnosis of  influenza and should not be used as a sole basis for treatment.  This  assay is FDA approved for nasopharyngeal swab specimens only. Nasal  washings and aspirates are unacceptable for Xpert Xpress Flu testing. Performed at [facility], [place] [address], [place], [place] [address]    Respiratory Panel by PCR     Status: Abnormal   Collection Time: 12/10/17  1:13 AM  Result Value Ref Range   Adenovirus NOT DETECTED NOT DETECTED   Coronavirus 229E NOT DETECTED NOT DETECTED   Coronavirus HKU1 NOT DETECTED NOT DETECTED   Coronavirus NL63 NOT DETECTED NOT DETECTED   Coronavirus OC43 NOT DETECTED NOT DETECTED   Metapneumovirus NOT DETECTED NOT DETECTED   Rhinovirus / Enterovirus NOT DETECTED NOT DETECTED   Influenza A NOT DETECTED NOT DETECTED   Influenza B NOT DETECTED NOT DETECTED   Parainfluenza Virus 1 NOT DETECTED NOT DETECTED   Parainfluenza Virus 2 NOT DETECTED NOT DETECTED   Parainfluenza Virus 3 NOT DETECTED NOT DETECTED   Parainfluenza Virus 4 NOT DETECTED NOT DETECTED   Respiratory Syncytial Virus DETECTED (A) NOT DETECTED    Comment: CRITICAL RESULT CALLED TO, READ BACK BY AND VERIFIED WITH: R Breydan Shillingburg,RN AT 1230 12/10/17 BY L BENFIELD    Bordetella pertussis NOT DETECTED NOT DETECTED   Chlamydophila pneumoniae NOT DETECTED NOT DETECTED   Mycoplasma pneumoniae NOT DETECTED NOT DETECTED    Comment: Performed at [facility], [place] [address] [place], [place] [address]  Urine rapid drug screen (hosp performed)     Status: None   Collection Time: 12/10/17  4:00 AM  Result Value Ref Range   Opiates NONE DETECTED NONE DETECTED   Cocaine NONE DETECTED NONE DETECTED   Benzodiazepines NONE DETECTED NONE DETECTED   Amphetamines NONE DETECTED NONE DETECTED   Tetrahydrocannabinol NONE DETECTED NONE DETECTED   Barbiturates NONE DETECTED NONE DETECTED    Comment: (NOTE) DRUG SCREEN FOR MEDICAL PURPOSES ONLY.  IF CONFIRMATION IS NEEDED FOR ANY PURPOSE, NOTIFY LAB WITHIN 5 DAYS. LOWEST DETECTABLE LIMITS FOR URINE DRUG SCREEN Drug Class                     Cutoff (ng/mL) Amphetamine and metabolites    1000 Barbiturate and metabolites    200 Benzodiazepine                 568 Tricyclics and metabolites     300 Opiates and metabolites        300 Cocaine and  metabolites        300 THC  50 Performed at [facility], [place] [address], [place], [place] [address]   Strep pneumoniae urinary antigen     Status: Abnormal   Collection Time: 12/10/17  4:14 AM  Result Value Ref Range   Strep Pneumo Urinary Antigen POSITIVE (A) NEGATIVE    Comment: Performed at [place] [address], [place], [place] [address]  Lactic acid, plasma     Status: Abnormal   Collection Time: 12/10/17  4:30 AM  Result Value Ref Range   Lactic Acid, Venous 4.4 (HH) 0.5 - 1.9 mmol/L    Comment: CRITICAL RESULT CALLED TO, READ BACK BY AND VERIFIED WITH: L ADKINS,RN 12/10/17 6010 RHOLMES Performed at [facility], [place] [address], [place], [place] [address]   Procalcitonin     Status: None   Collection Time: 12/10/17  4:30 AM  Result Value Ref Range   Procalcitonin 0.45 ng/mL    Comment:        Interpretation: PCT (Procalcitonin) <= 0.5 ng/mL: Systemic infection (sepsis) is not likely. Local bacterial infection is possible. (NOTE)       Sepsis PCT Algorithm           Lower Respiratory Tract                                      Infection PCT Algorithm    ----------------------------     ----------------------------         PCT < 0.25 ng/mL                PCT < 0.10 ng/mL         Strongly encourage             Strongly discourage   discontinuation of antibiotics    initiation of antibiotics    ----------------------------     -----------------------------       PCT 0.25 - 0.50 ng/mL            PCT 0.10 - 0.25 ng/mL               OR       >80% decrease in PCT            Discourage initiation of                                            antibiotics      Encourage discontinuation           of antibiotics    ----------------------------     -----------------------------         PCT >= 0.50 ng/mL              PCT 0.26 - 0.50 ng/mL               AND        <80% decrease in PCT             Encourage  initiation of                                             antibiotics       Encourage continuation  of antibiotics    ----------------------------     -----------------------------        PCT >= 0.50 ng/mL                  PCT > 0.50 ng/mL               AND         increase in PCT                  Strongly encourage                                      initiation of antibiotics    Strongly encourage escalation           of antibiotics                                     -----------------------------                                           PCT <= 0.25 ng/mL                                                 OR                                        > 80% decrease in PCT                                     Discontinue / Do not initiate                                             antibiotics Performed at [place] [address], [place], [place] [address]   CBC     Status: Abnormal   Collection Time: 12/10/17  4:30 AM  Result Value Ref Range   WBC 13.6 (H) 4.0 - 10.5 K/uL   RBC 3.70 (L) 4.22 - 5.81 MIL/uL   Hemoglobin 11.1 (L) 13.0 - 17.0 g/dL   HCT 32.2 (L) 39.0 - 52.0 %   MCV 87.0 78.0 - 100.0 fL   MCH 30.0 26.0 - 34.0 pg   MCHC 34.5 30.0 - 36.0 g/dL   RDW 11.9 11.5 - 15.5 %   Platelets 239 150 - 400 K/uL    Comment: Performed at [facility], [place] [address], [place], [place] [address]  Basic metabolic panel     Status: Abnormal   Collection Time: 12/10/17  4:30 AM  Result Value Ref Range   Sodium 138 135 - 145 mmol/L   Potassium 3.7 3.5 - 5.1 mmol/L   Chloride 107 101 - 111 mmol/L   CO2 20 (L) 22 - 32 mmol/L   Glucose, Bld 305 (H) 65 -  99 mg/dL   BUN 24 (H) 6 - 20 mg/dL   Creatinine, Ser 1.58 (H) 0.61 - 1.24 mg/dL   Calcium 7.8 (L) 8.9 - 10.3 mg/dL   GFR calc non Af Amer 59 (L) >60 mL/min   GFR calc Af Amer >60 >60 mL/min    Comment: (NOTE) The eGFR has been calculated using the CKD EPI equation. This calculation has  not been validated in all clinical situations. eGFR's persistently <60 mL/min signify possible Chronic Kidney Disease.    Anion gap 11 5 - 15    Comment: Performed at [facility], [place] [address], [place], [place] [address]  CBG monitoring, ED     Status: Abnormal   Collection Time: 12/10/17  4:45 AM  Result Value Ref Range   Glucose-Capillary 250 (H) 65 - 99 mg/dL   Comment 1 Notify RN   Culture, sputum-assessment     Status: None   Collection Time: 12/10/17  7:55 AM  Result Value Ref Range   Specimen Description SPUTUM    Special Requests NONE    Sputum evaluation      THIS SPECIMEN IS ACCEPTABLE FOR SPUTUM CULTURE Performed at [facility], [place] [address], [place], [place] [address]    Report Status 12/10/2017 FINAL   Gram stain     Status: None (Preliminary result)   Collection Time: 12/10/17  7:55 AM  Result Value Ref Range   Specimen Description TRACHEAL SITE    Special Requests NONE    Gram Stain      ABUNDANT WBC PRESENT,BOTH PMN AND MONONUCLEAR GRAM POSITIVE COCCI IN PAIRS GRAM NEGATIVE RODS GRAM POSITIVE RODS CRITICAL RESULT CALLED TO, READ BACK BY AND VERIFIED WITH: Q.MILLNER,RN 12/10/17 '@1035'$  BY V.WILKINS CONFIRMED BY J.ROCK Performed at [facility], [place] [address], [place], [place] [address]    Report Status PENDING   Lactic acid, plasma     Status: Abnormal   Collection Time: 12/10/17  7:55 AM  Result Value Ref Range   Lactic Acid, Venous 2.1 (HH) 0.5 - 1.9 mmol/L    Comment: CRITICAL RESULT CALLED TO, READ BACK BY AND VERIFIED WITH: P DOWD,RN 12/10/17 1114 RHOLMES Performed at [facility], [place] [address] [place], [place] [address]   CBG monitoring, ED     Status: Abnormal   Collection Time: 12/10/17  8:22 AM  Result Value Ref Range   Glucose-Capillary 281 (H) 65 - 99 mg/dL  CBG monitoring, ED     Status: Abnormal   Collection Time: 12/10/17 11:40 AM  Result Value Ref Range    Glucose-Capillary 311 (H) 65 - 99 mg/dL   Dg Chest 2 View  Result Date: 12/09/2017 CLINICAL DATA:  Cough EXAM: CHEST  2 VIEW COMPARISON:  12/07/2017 FINDINGS: The heart size and mediastinal contours are within normal limits. Both lungs are clear. The visualized skeletal structures are unremarkable. IMPRESSION: No active cardiopulmonary disease. Electronically Signed   By: Misty Stanley M.D.   On: 12/09/2017 19:13   Dg Chest Port 1 View  Result Date: 12/10/2017 CLINICAL DATA:  Cough and fever for 3 days. EXAM: PORTABLE CHEST 1 VIEW COMPARISON:  Chest radiograph December 09, 2017 FINDINGS: Cardiomediastinal silhouette is normal. Pulmonary vascular shadows accentuated by AP technique. No pleural effusions or focal consolidations. Trachea projects midline and there is no pneumothorax. Soft tissue planes and included osseous structures are non-suspicious. IMPRESSION: No active disease. Electronically Signed   By: Elon Alas M.D.   On: 12/10/2017 06:03  Pending Labs Unresulted Labs (From admission, onward)   Start     Ordered   12/11/17 2671  Basic metabolic panel  Daily,   R     12/10/17 1142   12/10/17 0755  Culture, respiratory (NON-Expectorated)  Once,   R     12/10/17 0755   12/10/17 0417  Legionella Pneumophila Serogp 1 Ur Ag  Once,   R     12/10/17 0418   12/10/17 0321  HIV antibody  STAT,   STAT     12/10/17 0321   12/10/17 0320  Blood culture (routine x 2)  BLOOD CULTURE X 2,   STAT     12/10/17 0320      Vitals/Pain Today's Vitals   12/10/17 1230 12/10/17 1300 12/10/17 1306 12/10/17 1309  BP: (!) 148/76 122/68  122/68  Pulse: (!) 109 (!) 109  (!) 113  Resp: (!) 24 (!) 24  (!) 41  Temp:      TempSrc:      SpO2: 98% 94% 97% 96%  PainSc:        Isolation Precautions Droplet precaution  Medications Medications  hydrALAZINE (APRESOLINE) injection 10 mg (not administered)  enoxaparin (LOVENOX) injection 40 mg (40 mg Subcutaneous Given 12/10/17 0829)  acetaminophen  (TYLENOL) tablet 650 mg (650 mg Oral Given 12/10/17 0828)    Or  acetaminophen (TYLENOL) suppository 650 mg ( Rectal See Alternative 12/10/17 0828)  ondansetron (ZOFRAN) tablet 4 mg ( Oral See Alternative 12/10/17 0650)    Or  ondansetron (ZOFRAN) injection 4 mg (4 mg Intravenous Given 12/10/17 0650)  ipratropium-albuterol (DUONEB) 0.5-2.5 (3) MG/3ML nebulizer solution 3 mL (3 mLs Nebulization Given 12/10/17 1217)  guaiFENesin (MUCINEX) 12 hr tablet 600 mg (600 mg Oral Given 12/10/17 0845)  cefTRIAXone (ROCEPHIN) 1 g in sodium chloride 0.9 % 100 mL IVPB (not administered)  azithromycin (ZITHROMAX) 500 mg in sodium chloride 0.9 % 250 mL IVPB (not administered)  insulin aspart (novoLOG) injection 0-20 Units (15 Units Subcutaneous Given 12/10/17 1216)  insulin aspart (novoLOG) injection 0-5 Units (not administered)  0.9 %  sodium chloride infusion (100 mL/hr Intravenous New Bag/Given 12/10/17 1216)  methylPREDNISolone sodium succinate (SOLU-MEDROL) 40 mg/mL injection 40 mg (not administered)  insulin glargine (LANTUS) injection 30 Units (not administered)  sodium chloride 0.9 % bolus 1,000 mL (0 mLs Intravenous Stopped 12/10/17 0139)  acetaminophen (TYLENOL) tablet 1,000 mg (1,000 mg Oral Given 12/09/17 2353)  albuterol (PROVENTIL) (2.5 MG/3ML) 0.083% nebulizer solution 5 mg (5 mg Nebulization Given 12/10/17 0018)  ondansetron (ZOFRAN) injection 4 mg (4 mg Intravenous Given 12/09/17 2353)  insulin aspart (novoLOG) injection 14 Units (14 Units Subcutaneous Given 12/10/17 0105)  albuterol (PROVENTIL,VENTOLIN) solution continuous neb (10 mg/hr Nebulization Given 12/10/17 0145)  ipratropium (ATROVENT) nebulizer solution 0.5 mg (0.5 mg Nebulization Given 12/10/17 0145)  cefTRIAXone (ROCEPHIN) 1 g in sodium chloride 0.9 % 100 mL IVPB (0 g Intravenous Stopped 12/10/17 0452)  azithromycin (ZITHROMAX) 500 mg in sodium chloride 0.9 % 250 mL IVPB (0 mg Intravenous Stopped 12/10/17 0601)  sodium chloride 0.9 % bolus 1,000  mL (0 mLs Intravenous Stopped 12/10/17 0452)  methylPREDNISolone sodium succinate (SOLU-MEDROL) 125 mg/2 mL injection 60 mg (60 mg Intravenous Given 12/10/17 0600)  sodium chloride 0.9 % bolus 1,000 mL (0 mLs Intravenous Stopped 12/10/17 1215)  calcium gluconate 1 g in sodium chloride 0.9 % 100 mL IVPB (0 g Intravenous Stopped 12/10/17 0759)  sodium chloride 0.9 % bolus 1,000 mL (0 mLs Intravenous Stopped 12/10/17 1214)  Mobility walks

## 2017-12-10 NOTE — ED Notes (Signed)
Unable to ambulate due to pt's oxygen being 86% on rm air while laying in the bed.

## 2017-12-10 NOTE — ED Notes (Signed)
NT attempted to ambulate pt however pt immediately desats to mis 80s on Rm Air. MD notified and orders given.

## 2017-12-10 NOTE — ED Notes (Signed)
RN walked in to pt room to check on pt due to low O2 sat, pt removed nasal canula and stated "I can not eat with it on, once I am done I will put it back on".

## 2017-12-10 NOTE — H&P (Signed)
History and Physical    Antonio Martin QQV:[id number] DOB: 10/21/1991 DOA: 12/09/2017  Referring MD/NP/PA: Dr. Baxter HireKristen Ward PCP: Patient, No Pcp Per   Patient coming from: home  Chief Complaint: Fever  I have personally briefly reviewed patient[facility]   HPI: Antonio Martin is a 27 y.o. [place] Medic[place]ign[place] speaking male with medical history significant of DM type I; who presents with fever, productive cough, congestion, nausea, vomiting, and generalized malaise over the last 1 week.  Patient was seen in the emergency department 3 days ago, after going to urgent care found to have a glucose of 28.  He had been given D50 prior to transport with blood sugars noted to be 178 on arrival.  He was suspected to have bronchitis at that time and sent home with albuterol inhaler and Tessalon Perles.  Since being home patient reports no improvement in symptoms with worsening shortness of breath.  He denies any recent travel out of the [place] in the last 6 months to year.  Associated symptoms include poor appetite.   ED Course: Upon admission into the emergency department patient was seen to be febrile up to 101.3 F, respirations 14-26, blood pressure 83-140 after breathing treatments, blood pressure 126/66, and O2 saturations as low as 83% on room air placed to 2 L nasal cannula with improvement of O2 sats greater than 92%.  Labs revealed WBC 12.4 BUN 23, creatinine 1.48, glucose 276.  Patient received multiple breathing treatments, 14 units of NovoLog insulin, 2 L of normal saline IV fluids, blood cultures obtained, and received antibiotics Rocephin and azithromycin .  Influenza screen came back negative.  TR[facility] called to admit.  Review of Systems  Constitutional: Positive for chills, fever and malaise/fatigue.  HENT: Positive for congestion. Negative for nosebleeds.   Eyes: Negative for photophobia and pain.  Respiratory: Positive for cough, sputum production, shortness of breath  and wheezing.   Cardiovascular: Negative for chest pain, leg swelling and PND.  Gastrointestinal: Positive for nausea and vomiting. Negative for diarrhea.  Genitourinary: Negative for dysuria.  Musculoskeletal: Negative for falls.  Skin: Negative for itching and rash.  Neurological: Positive for weakness. Negative for speech change and focal weakness.  Psychiatric/Behavioral: Negative for memory loss and suicidal ideas.    Past Medical History:  Diagnosis Date  . Diabetes mellitus without complication [facility](HCC)     Past Surgical History:  Procedure Laterality Date  . EYE SURGERY    . FINGER SURGERY       reports that  has never smoked. he has never used smokeless tobacco. He reports that he drinks alcohol. He reports that he does not use drugs.  Allergies  Allergen Reactions  . Tape Rash    No family history on file.  Prior to Admission medications   Medication Sig Start Date End Date Taking? Authorizing Provider  albuterol (PROVENTIL HFA;VENTOLIN HFA) 108 (90 Base) MCG/ACT inhaler Inhale 1-2 puffs into the lungs every 6 (six) hours as needed for wheezing or shortness of breath. 12/07/17  Yes Lorre NickAllen, Anthony, MD  benzonatate (TESSALON) 100 MG capsule Take 1 capsule (100 mg total) by mouth every 8 (eight) hours. 12/07/17  Yes Lorre NickAllen, Anthony, MD  insulin aspart (NOVOLOG FLEXPEN) 100 UNIT/ML FlexPen Inject 12 Units into the skin 3 (three) times daily before meals. Per sliding scale   Yes [provider]  lisinopril (PRINIVIL,ZESTRIL) 10 MG tablet Take 10 mg by mouth daily. 11/23/17  Yes [provider]  TOUJEO SOLOSTAR 300 UNIT/ML  SOPN Inject 40 Units into the skin at bedtime.  03/15/17  Yes [provider]  ondansetron (ZOFRAN ODT) 4 MG disintegrating tablet Take 1 tablet (4 mg total) by mouth every 8 (eight) hours as needed for nausea or vomiting. Patient not taking: Reported on 11/13/2017 05/30/16   Alvira Monday, MD    Physical Exam:  Constitutional:  Ill-appearing male who is in moderate respiratory distress. Vitals:   12/10/17 0100 12/10/17 0249 12/10/17 0321 12/10/17 0335  BP: 129/62 130/66    Pulse: (!) 120 (!) 137 (!) 140   Resp:   (!) 26   Temp:    98.3 F (36.8 C)  TempSrc:    Oral  SpO2: (!) 83% 96% (!) 86%    Eyes: PERRL, lids and conjunctivae normal ENMT: Mucous membranes are dry. Posterior pharynx clear of any exudate or lesions. Neck: normal, supple, no masses, no thyromegaly Respiratory: Diffuse bilateral rhonchi appreciated with wheezes.  Patient able to talk and shortened sentences. Cardiovascular: Tachycardic, no murmurs / rubs / gallops. No extremity edema. 2+ pedal pulses. No carotid bruits.  Abdomen: no tenderness, no masses palpated. No hepatosplenomegaly. Bowel sounds positive.  Musculoskeletal: no clubbing / cyanosis. No joint deformity upper and lower extremities. Good ROM, no contractures. Normal muscle tone.  Skin: no rashes, lesions, ulcers. No induration Neurologic: CN 2-12 grossly intact. Sensation intact, DTR normal. Strength 5/5 in all 4.  Psychiatric: Normal judgment and insight. Alert and oriented x 3. Normal mood.     Labs on Admission: I have personally reviewed following labs and imaging studies  CBC: Recent Labs  Lab 12/09/17 1832  WBC 12.4*  HGB 13.6  HCT 38.1*  MCV 85.8  PLT 272   Basic Metabolic Panel: Recent Labs  Lab 12/09/17 1832  NA 135  K 4.0  CL 104  CO2 24  GLUCOSE 276*  BUN 23*  CREATININE 1.48*  CALCIUM 8.9   GFR: CrCl cannot be calculated (Unknown ideal weight.). Liver Function Tests: No results for input(s): AST, ALT, ALKPHOS, BILITOT, PROT, ALBUMIN in the last 168 hours. No results for input(s): LIPASE, AMYLASE in the last 168 hours. No results for input(s): AMMONIA in the last 168 hours. Coagulation Profile: No results for input(s): INR, PROTIME in the last 168 hours. Cardiac Enzymes: No results for input(s): CKTOTAL, CKMB, CKMBINDEX, TROPONINI in the last  168 hours. BNP (last 3 results) No results for input(s): PROBNP in the last 8760 hours. HbA1C: No results for input(s): HGBA1C in the last 72 hours. CBG: Recent Labs  Lab 12/07/17 1913 12/07/17 2012 12/09/17 1825 12/10/17 0000  GLUCAP 179* 256* 277* 236*   Lipid Profile: No results for input(s): CHOL, HDL, LDLCALC, TRIG, CHOLHDL, LDLDIRECT in the last 72 hours. Thyroid Function Tests: No results for input(s): TSH, T4TOTAL, FREET4, T3FREE, THYROIDAB in the last 72 hours. Anemia Panel: No results for input(s): VITAMINB12, FOLATE, FERRITIN, TIBC, IRON, RETICCTPCT in the last 72 hours. Urine analysis:    Component Value Date/Time   COLORURINE STRAW (A) 11/13/2017 1640   APPEARANCEUR CLEAR 11/13/2017 1640   LABSPEC 1.015 11/13/2017 1640   PHURINE 7.0 11/13/2017 1640   GLUCOSEU >=500 (A) 11/13/2017 1640   HGBUR NEGATIVE 11/13/2017 1640   BILIRUBINUR NEGATIVE 11/13/2017 1640   BILIRUBINUR neg 01/14/2016 0955   KETONESUR NEGATIVE 11/13/2017 1640   PROTEINUR 100 (A) 11/13/2017 1640   UROBILINOGEN 0.2 01/14/2016 0955   UROBILINOGEN 0.2 07/29/2015 1805   NITRITE NEGATIVE 11/13/2017 1640   LEUKOCYTESUR NEGATIVE 11/13/2017 1640   Sepsis  Labs: No results found for this or any previous visit (from the past 240 hour(s)).   Radiological Exams on Admission: Dg Chest 2 View  Result Date: 12/09/2017 CLINICAL DATA:  Cough EXAM: CHEST  2 VIEW COMPARISON:  12/07/2017 FINDINGS: The heart size and mediastinal contours are within normal limits. Both lungs are clear. The visualized skeletal structures are unremarkable. IMPRESSION: No active cardiopulmonary disease. Electronically Signed   By: Kennith Center M.D.   On: 12/09/2017 19:13    EKG: Independently reviewed.  Sinus tachycardia  Assessment/Plan Respiratory failure with hypoxia 2/2 sepsis with suspected community-acquired pneumonia: Acute.  Patient presents with fever up to 101.3 F with tachypnea and WBC 12.4.  Chest x-ray was otherwise  noted to be clear.  Physical exam patient has rhonchorous breath with wheezing.  Given empiric antibiotics of ceftriaxone and azithromycin for suspected community-acquired pneumonia. - Admit to telemetry bed - Continuous pulse oximetry with nasal cannula oxygen to keep O2 sat greater than 92% - Follow-up blood and sputum cultures  - Added on respiratory virus panel and lactic acid - Continue empiric antibiotics of ceftriaxone and azithromycin - Solu-Medrol 60 mg IV x1 now - Check UDS - Tylenol prn fever  Acute kidney injury: Patient's baseline ranges from 1-1.2, but presents with a creatinine of 1.48 with BUN 23.  - IV fluids 166ml/hr as tolerated  - Recheck BMP in a.m.   DM type I: Patient presents with elevated blood sugar of 276 on admission.  Given 14 units of NovoLog while in the ED.  Patient with poor - Hypoglycemic protocols - Continue home regimen of glargine - CBGs q. before meals and at bedtime with resistant SSI and adjust regimen as needed  Hypertension: Blood pressures currently stable. - Restart lisinopril when medically appropriate - Hydralazine  IV prn  DVT prophylaxis: lovenox Code Status: Full  Family Communication: no Present at bedside Disposition Plan: Likely discharge home in 2-3 days once medically stable Consults called: none  Admission status: inpatient   Clydie Braun MD [facility] Pager [phone]   If 7PM-7AM, please contact night-coverage [email or web address] Password [facility]  12/10/2017, 3:56 AM

## 2017-12-10 NOTE — ED Notes (Signed)
Dr, Katrinka BlazingSmith notified of elevated Lactic Acid results

## 2017-12-10 NOTE — Progress Notes (Signed)
[facility] PROGRESS NOTE    Progress Note  Thorsten Climer  RUE:[id number] DOB: [birth year] DOA: 12/09/2017 PCP: Patient, No Pcp Per     Brief Narrative:   Clem Wisenbaker is an 27 y.o. male patient [place] speaking past medical history is mellitus type I comes in with fever productive cough and generalized malaise that started 1 week prior to admission.  Assessment/Plan:   Acute respiratory failure with hypoxia (HCC) due to Community acquired pneumonia/sepsis: Patient D satting into the 80s, he was placed on nasal cannula and saturations remained at 92. He was started empirically on IV Rocephin and azithromycin, due to the significant wheezing and poor air movement he was also started on IV Solu-Medrol. He was fluid resuscitated with 5 L of normal saline and his lactate has improved.   Type 1 diabetes mellitus (HCC) Change his Lantus to 20 mg twice daily, continue sliding scale insulin check an A1c  Acute kidney injury (HCC) Baseline creatinine is less than 1. Likely prerenal in etiology continue IV fluid resuscitation recheck a basic metabolic panel in the morning. Continue to hold ARB in the setting of new acute kidney injury  Essential hypertension To hold ACE inhibitor, blood pressure is borderline low patient is currently asymptomatic.   DVT prophylaxis: lovenox Family Communication:none Disposition Plan/Barrier to D/C: home in 2 days Code Status:     Code Status Orders  (From admission, onward)        Start     Ordered   12/10/17 0415  Full code  Continuous     12/10/17 0418    Code Status History    Date Active Date Inactive Code Status Order ID Comments User Context   This patient has a current code status but no historical code status.        IV Access:    Peripheral IV   Procedures and diagnostic studies:   Dg Chest 2 View  Result Date: 12/09/2017 CLINICAL DATA:  Cough EXAM: CHEST  2 VIEW COMPARISON:  12/07/2017 FINDINGS: The heart  size and mediastinal contours are within normal limits. Both lungs are clear. The visualized skeletal structures are unremarkable. IMPRESSION: No active cardiopulmonary disease. Electronically Signed   By: Kennith Center M.D.   On: 12/09/2017 19:13   Dg Chest Port 1 View  Result Date: 12/10/2017 CLINICAL DATA:  Cough and fever for 3 days. EXAM: PORTABLE CHEST 1 VIEW COMPARISON:  Chest radiograph December 09, 2017 FINDINGS: Cardiomediastinal silhouette is normal. Pulmonary vascular shadows accentuated by AP technique. No pleural effusions or focal consolidations. Trachea projects midline and there is no pneumothorax. Soft tissue planes and included osseous structures are non-suspicious. IMPRESSION: No active disease. Electronically Signed   By: Awilda Metro M.D.   On: 12/10/2017 06:03     Medical Consultants:    None.  Anti-Infectives:   IV Rocephin and azithromycin.  Subjective:    [employer] he relates he continues to be short of breath with exertion, continues to have a productive cough, he does relate he feels slightly better than when he came in but still feels fatigued and tired.  Objective:    Vitals:   12/10/17 1000 12/10/17 1025 12/10/17 1030 12/10/17 1035  BP: 123/61  126/73   Pulse: (!) 117  (!) 112   Resp: (!) 23  (!) 23   Temp:      TempSrc:      SpO2: 92% 94% 94% (S) 93%    Intake/Output Summary (Last 24 hours) at 12/10/2017 1137 Last  data filed at 12/10/2017 0759 Gross per 24 hour  Intake 110 ml  Output -  Net 110 ml   There were no vitals filed for this visit.  Exam: General exam: In no acute distress. Respiratory system: He has poor air movement and wheezing bilaterally. Cardiovascular system: S1 & S2 heard, RRR. No JVD. Gastrointestinal system: Abdomen is nondistended, soft and nontender.  Central nervous system: Alert and oriented. No focal neurological deficits. Extremities: No pedal edema.    Data Reviewed:    Labs: Basic  Metabolic Panel: Recent Labs  Lab 12/09/17 1832 12/10/17 0430  NA 135 138  K 4.0 3.7  CL 104 107  CO2 24 20*  GLUCOSE 276* 305*  BUN 23* 24*  CREATININE 1.48* 1.58*  CALCIUM 8.9 7.8*   GFR CrCl cannot be calculated (Unknown ideal weight.). Liver Function Tests: No results for input(s): AST, ALT, ALKPHOS, BILITOT, PROT, ALBUMIN in the last 168 hours. No results for input(s): LIPASE, AMYLASE in the last 168 hours. No results for input(s): AMMONIA in the last 168 hours. Coagulation profile No results for input(s): INR, PROTIME in the last 168 hours.  CBC: Recent Labs  Lab 12/09/17 1832 12/10/17 0430  WBC 12.4* 13.6*  HGB 13.6 11.1*  HCT 38.1* 32.2*  MCV 85.8 87.0  PLT 272 239   Cardiac Enzymes: No results for input(s): CKTOTAL, CKMB, CKMBINDEX, TROPONINI in the last 168 hours. BNP (last 3 results) No results for input(s): PROBNP in the last 8760 hours. CBG: Recent Labs  Lab 12/07/17 2012 12/09/17 1825 12/10/17 0000 12/10/17 0445 12/10/17 0822  GLUCAP 256* 277* 236* 250* 281*   D-Dimer: No results for input(s): DDIMER in the last 72 hours. Hgb A1c: No results for input(s): HGBA1C in the last 72 hours. Lipid Profile: No results for input(s): CHOL, HDL, LDLCALC, TRIG, CHOLHDL, LDLDIRECT in the last 72 hours. Thyroid function studies: No results for input(s): TSH, T4TOTAL, T3FREE, THYROIDAB in the last 72 hours.  Invalid input(s): FREET3 Anemia work up: No results for input(s): VITAMINB12, FOLATE, FERRITIN, TIBC, IRON, RETICCTPCT in the last 72 hours. Sepsis Labs: Recent Labs  Lab 12/09/17 1832 12/10/17 0430 12/10/17 0755  PROCALCITON  --  0.45  --   WBC 12.4* 13.6*  --   LATICACIDVEN  --  4.4* 2.1*   Microbiology Recent Results (from the past 240 hour(s))  Culture, sputum-assessment     Status: None   Collection Time: 12/10/17  7:55 AM  Result Value Ref Range Status   Specimen Description SPUTUM  Final   Special Requests NONE  Final   Sputum  evaluation   Final    THIS SPECIMEN IS ACCEPTABLE FOR SPUTUM CULTURE Performed at [facility], [address] [place], [place] [address]    Report Status 12/10/2017 FINAL  Final  Gram stain     Status: None (Preliminary result)   Collection Time: 12/10/17  7:55 AM  Result Value Ref Range Status   Specimen Description TRACHEAL SITE  Final   Special Requests NONE  Final   Gram Stain   Final    ABUNDANT WBC PRESENT,BOTH PMN AND MONONUCLEAR GRAM POSITIVE COCCI IN PAIRS GRAM NEGATIVE RODS GRAM POSITIVE RODS CRITICAL RESULT CALLED TO, READ BACK BY AND VERIFIED WITH: Q.MILLNER,RN 12/10/17 @1035  BY V.WILKINS CONFIRMED BY J.ROCK Performed at [facility], [address] [place], [place] [address]    Report Status PENDING  Incomplete     Medications:   . enoxaparin (LOVENOX) injection  40 mg  Subcutaneous Daily  . guaiFENesin  600 mg Oral BID  . insulin aspart  0-20 Units Subcutaneous TID WC  . insulin aspart  0-5 Units Subcutaneous QHS  . insulin glargine  40 Units Subcutaneous QHS  . ipratropium-albuterol  3 mL Nebulization QID   Continuous Infusions: . sodium chloride 100 mL/hr at 12/10/17 0453  . azithromycin    . cefTRIAXone (ROCEPHIN)  IV        LOS: 0 days   Marinda Elkbraham Feliz Ortiz  [facility] Pager [phone]  *Please refer to [email or web address], password [email or web address] to get updated schedule on who will round on this patient, as hospitalists switch teams weekly. If 7PM-7AM, please contact night-coverage at [email or web address], password [email or web address] for any overnight needs.  12/10/2017, 11:37 AM

## 2017-12-10 NOTE — ED Notes (Signed)
Pt Nasal Canula has been placed back on pt on 4L.

## 2017-12-11 DIAGNOSIS — R6520 Severe sepsis without septic shock: Secondary | ICD-10-CM

## 2017-12-11 DIAGNOSIS — A419 Sepsis, unspecified organism: Secondary | ICD-10-CM | POA: Insufficient documentation

## 2017-12-11 DIAGNOSIS — J13 Pneumonia due to Streptococcus pneumoniae: Secondary | ICD-10-CM

## 2017-12-11 LAB — LEGIONELLA PNEUMOPHILA SEROGP 1 UR AG: L. pneumophila Serogp 1 Ur Ag: NEGATIVE

## 2017-12-11 LAB — BASIC METABOLIC PANEL
ANION GAP: 6 (ref 5–15)
BUN: 15 mg/dL (ref 6–20)
CHLORIDE: 114 mmol/L — AB (ref 101–111)
CO2: 20 mmol/L — AB (ref 22–32)
Calcium: 8.4 mg/dL — ABNORMAL LOW (ref 8.9–10.3)
Creatinine, Ser: 1.03 mg/dL (ref 0.61–1.24)
GFR calc non Af Amer: >60 mL/min (ref >60)
GLUCOSE: 227 mg/dL — AB (ref 65–99)
Potassium: 4.4 mmol/L (ref 3.5–5.1)
Sodium: 140 mmol/L (ref 135–145)

## 2017-12-11 LAB — GLUCOSE, CAPILLARY
GLUCOSE-CAPILLARY: 132 mg/dL — AB (ref 65–99)
Glucose-Capillary: 182 mg/dL — ABNORMAL HIGH (ref 65–99)
Glucose-Capillary: 165 mg/dL — ABNORMAL HIGH (ref 65–99)
Glucose-Capillary: 163 mg/dL — ABNORMAL HIGH (ref 65–99)
Glucose-Capillary: 64 mg/dL — ABNORMAL LOW (ref 65–99)

## 2017-12-11 MED ORDER — LEVOFLOXACIN 500 MG PO TABS
500.0000 mg | ORAL_TABLET | Freq: Every day | ORAL | Status: DC
  Administered 2017-12-12: 500 mg via ORAL
  Filled 2017-12-11: qty 1

## 2017-12-11 MED ORDER — INSULIN GLARGINE 100 UNIT/ML ~~LOC~~ SOLN
50.0000 [IU] | Freq: Two times a day (BID) | SUBCUTANEOUS | Status: DC
  Filled 2017-12-11 (×2): qty 0.5

## 2017-12-11 MED ORDER — IPRATROPIUM-ALBUTEROL 0.5-2.5 (3) MG/3ML IN SOLN
RESPIRATORY_TRACT | Status: AC
  Filled 2017-12-11: qty 3

## 2017-12-11 MED ORDER — LEVOFLOXACIN IN D5W 750 MG/150ML IV SOLN
750.0000 mg | Freq: Once | INTRAVENOUS | Status: AC
  Administered 2017-12-11: 750 mg via INTRAVENOUS
  Filled 2017-12-11: qty 150

## 2017-12-11 MED ORDER — LEVOFLOXACIN 500 MG PO TABS: 500.0000 mg | ORAL_TABLET | Freq: Every day | ORAL | Status: DC

## 2017-12-11 NOTE — Progress Notes (Signed)
  Patient Saturations on Room Air at Rest = 96%  Patient Saturations on Room Air while Ambulating = 94%   

## 2017-12-11 NOTE — Progress Notes (Signed)
[facility] PROGRESS NOTE    Progress Note  Coleby Yett  ONG:[id number] DOB: Oct 24, 1991 DOA: 12/09/2017 PCP: Patient, No Pcp Per     Brief Narrative:   Therin Vetsch is an 27 y.o. male patient Tigringa speaking past medical history is mellitus type I comes in with fever productive cough and generalized malaise that started 1 week prior to admission.  Assessment/Plan:   Acute respiratory failure with hypoxia (HCC) due to RSV/possible sepsis due to strep pneumo He was started empirically on IV Rocephin and azithromycin, urine pneumo and RSV panel positive. His antibiotic regimen was de-escalated to oral Levaquin, he has remained afebrile.  Relating check saturations with ambulation.   Type 1 diabetes mellitus (HCC) Blood glucose running high increase Lantus, continue sliding scale insulin check an A1c  Acute kidney injury (HCC) Baseline creatinine is less than 1. Likely prerenal in etiology, resolved with IV fluid hydration.  Essential hypertension To hold ACE inhibitor, blood pressure is improved.   DVT prophylaxis: lovenox Family Communication:none Disposition Plan/Barrier to D/C: home in a.m. Code Status:     Code Status Orders  (From admission, onward)        Start     Ordered   12/10/17 0415  Full code  Continuous     12/10/17 0418    Code Status History    Date Active Date Inactive Code Status Order ID Comments User Context   This patient has a current code status but no historical code status.        IV Access:    Peripheral IV   Procedures and diagnostic studies:   Dg Chest 2 View  Result Date: 12/09/2017 CLINICAL DATA:  Cough EXAM: CHEST  2 VIEW COMPARISON:  12/07/2017 FINDINGS: The heart size and mediastinal contours are within normal limits. Both lungs are clear. The visualized skeletal structures are unremarkable. IMPRESSION: No active cardiopulmonary disease. Electronically Signed   By: Kennith Center M.D.   On: 12/09/2017 19:13     Dg Chest Port 1 View  Result Date: 12/10/2017 CLINICAL DATA:  Cough and fever for 3 days. EXAM: PORTABLE CHEST 1 VIEW COMPARISON:  Chest radiograph December 09, 2017 FINDINGS: Cardiomediastinal silhouette is normal. Pulmonary vascular shadows accentuated by AP technique. No pleural effusions or focal consolidations. Trachea projects midline and there is no pneumothorax. Soft tissue planes and included osseous structures are non-suspicious. IMPRESSION: No active disease. Electronically Signed   By: Awilda Metro M.D.   On: 12/10/2017 06:03     Medical Consultants:    None.  Anti-Infectives:   IV Rocephin and azithromycin.  Subjective:    [employer] he relates he was not able to sleep as he would wake up in the middle of the night short of breath coughing.  Objective:    Vitals:   12/10/17 2130 12/10/17 2153 12/11/17 0623 12/11/17 0852  BP: (!) 158/73  (!) 155/87   Pulse: 93  86   Resp: (!) 22  20   Temp: 99.3 F (37.4 C)  98.1 F (36.7 C)   TempSrc: Oral  Oral   SpO2: 96% 96% 99% 100%  Weight:      Height:        Intake/Output Summary (Last 24 hours) at 12/11/2017 1052 Last data filed at 12/11/2017 0200 Gross per 24 hour  Intake 4458.33 ml  Output -  Net 4458.33 ml   Filed Weights   12/10/17 1345  Weight: 61.9 kg (136 lb 7.4 oz)    Exam: General exam: In  no acute distress. Respiratory system: He has poor air movement and rhonchi bilaterally. Cardiovascular system: S1 & S2 heard, RRR. No JVD. Gastrointestinal system: Abdomen is nondistended, soft and nontender.  Central nervous system: Alert and oriented. No focal neurological deficits. Extremities: No pedal edema.    Data Reviewed:    Labs: Basic Metabolic Panel: Recent Labs  Lab 12/09/17 1832 12/10/17 0430 12/11/17 0526  NA 135 138 140  K 4.0 3.7 4.4  CL 104 107 114*  CO2 24 20* 20*  GLUCOSE 276* 305* 227*  BUN 23* 24* 15  CREATININE 1.48* 1.58* 1.03  CALCIUM 8.9 7.8* 8.4*    GFR Estimated Creatinine Clearance: 86.7 mL/min (by C-G formula based on SCr of 1.03 mg/dL). Liver Function Tests: No results for input(s): AST, ALT, ALKPHOS, BILITOT, PROT, ALBUMIN in the last 168 hours. No results for input(s): LIPASE, AMYLASE in the last 168 hours. No results for input(s): AMMONIA in the last 168 hours. Coagulation profile No results for input(s): INR, PROTIME in the last 168 hours.  CBC: Recent Labs  Lab 12/09/17 1832 12/10/17 0430  WBC 12.4* 13.6*  HGB 13.6 11.1*  HCT 38.1* 32.2*  MCV 85.8 87.0  PLT 272 239   Cardiac Enzymes: No results for input(s): CKTOTAL, CKMB, CKMBINDEX, TROPONINI in the last 168 hours. BNP (last 3 results) No results for input(s): PROBNP in the last 8760 hours. CBG: Recent Labs  Lab 12/10/17 0822 12/10/17 1140 12/10/17 1645 12/10/17 2128 12/11/17 0758  GLUCAP 281* 311* 176* 250* 182*   D-Dimer: No results for input(s): DDIMER in the last 72 hours. Hgb A1c: No results for input(s): HGBA1C in the last 72 hours. Lipid Profile: No results for input(s): CHOL, HDL, LDLCALC, TRIG, CHOLHDL, LDLDIRECT in the last 72 hours. Thyroid function studies: No results for input(s): TSH, T4TOTAL, T3FREE, THYROIDAB in the last 72 hours.  Invalid input(s): FREET3 Anemia work up: No results for input(s): VITAMINB12, FOLATE, FERRITIN, TIBC, IRON, RETICCTPCT in the last 72 hours. Sepsis Labs: Recent Labs  Lab 12/09/17 1832 12/10/17 0430 12/10/17 0755  PROCALCITON  --  0.45  --   WBC 12.4* 13.6*  --   LATICACIDVEN  --  4.4* 2.1*   Microbiology Recent Results (from the past 240 hour(s))  Respiratory Panel by PCR     Status: Abnormal   Collection Time: 12/10/17  1:13 AM  Result Value Ref Range Status   Adenovirus NOT DETECTED NOT DETECTED Final   Coronavirus 229E NOT DETECTED NOT DETECTED Final   Coronavirus HKU1 NOT DETECTED NOT DETECTED Final   Coronavirus NL63 NOT DETECTED NOT DETECTED Final   Coronavirus OC43 NOT DETECTED NOT  DETECTED Final   Metapneumovirus NOT DETECTED NOT DETECTED Final   Rhinovirus / Enterovirus NOT DETECTED NOT DETECTED Final   Influenza A NOT DETECTED NOT DETECTED Final   Influenza B NOT DETECTED NOT DETECTED Final   Parainfluenza Virus 1 NOT DETECTED NOT DETECTED Final   Parainfluenza Virus 2 NOT DETECTED NOT DETECTED Final   Parainfluenza Virus 3 NOT DETECTED NOT DETECTED Final   Parainfluenza Virus 4 NOT DETECTED NOT DETECTED Final   Respiratory Syncytial Virus DETECTED (A) NOT DETECTED Final    Comment: CRITICAL RESULT CALLED TO, READ BACK BY AND VERIFIED WITH: R BARHAM,RN AT 1230 12/10/17 BY L BENFIELD    Bordetella pertussis NOT DETECTED NOT DETECTED Final   Chlamydophila pneumoniae NOT DETECTED NOT DETECTED Final   Mycoplasma pneumoniae NOT DETECTED NOT DETECTED Final    Comment: Performed at [facility], [address] [place], [place] [address]  Culture, sputum-assessment     Status: None   Collection Time: 12/10/17  7:55 AM  Result Value Ref Range Status   Specimen Description SPUTUM  Final   Special Requests NONE  Final   Sputum evaluation   Final    THIS SPECIMEN IS ACCEPTABLE FOR SPUTUM CULTURE Performed at [facility], [address] [place], [place] [address]    Report Status 12/10/2017 FINAL  Final  Gram stain     Status: None (Preliminary result)   Collection Time: 12/10/17  7:55 AM  Result Value Ref Range Status   Specimen Description TRACHEAL SITE  Final   Special Requests NONE  Final   Gram Stain   Final    ABUNDANT WBC PRESENT,BOTH PMN AND MONONUCLEAR GRAM POSITIVE COCCI IN PAIRS GRAM NEGATIVE RODS GRAM POSITIVE RODS CRITICAL RESULT CALLED TO, READ BACK BY AND VERIFIED WITH: Q.MILLNER,RN 12/10/17 @1035  BY V.WILKINS CONFIRMED BY J.ROCK Performed at [facility], [address] [place], [place] [address]    Report Status PENDING  Incomplete     Medications:   . enoxaparin (LOVENOX) injection  40 mg  Subcutaneous Daily  . guaiFENesin  600 mg Oral BID  . insulin aspart  0-20 Units Subcutaneous TID WC  . insulin aspart  0-5 Units Subcutaneous QHS  . insulin glargine  40 Units Subcutaneous BID  . ipratropium-albuterol  3 mL Nebulization QID  . methylPREDNISolone (SOLU-MEDROL) injection  40 mg Intravenous Q12H   Continuous Infusions: . sodium chloride 100 mL/hr at 12/11/17 0622  . azithromycin Stopped (12/10/17 2331)  . cefTRIAXone (ROCEPHIN)  IV Stopped (12/10/17 2206)      LOS: 1 day   Marinda ElkAbraham Feliz Ortiz  [facility] Pager [phone]  *Please refer to [email or web address], password [email or web address] to get updated schedule on who will round on this patient, as hospitalists switch teams weekly. If 7PM-7AM, please contact night-coverage at [email or web address], password [email or web address] for any overnight needs.  12/11/2017, 10:52 AM

## 2017-12-12 ENCOUNTER — Inpatient Hospital Stay (HOSPITAL_COMMUNITY): Payer: Medicaid Other

## 2017-12-12 DIAGNOSIS — I340 Nonrheumatic mitral (valve) insufficiency: Secondary | ICD-10-CM

## 2017-12-12 DIAGNOSIS — R600 Localized edema: Secondary | ICD-10-CM

## 2017-12-12 DIAGNOSIS — R0601 Orthopnea: Secondary | ICD-10-CM

## 2017-12-12 LAB — BASIC METABOLIC PANEL
ANION GAP: 11 (ref 5–15)
BUN: 19 mg/dL (ref 6–20)
CHLORIDE: 112 mmol/L — AB (ref 101–111)
CO2: 18 mmol/L — ABNORMAL LOW (ref 22–32)
Calcium: 8.7 mg/dL — ABNORMAL LOW (ref 8.9–10.3)
Creatinine, Ser: 1.07 mg/dL (ref 0.61–1.24)
GFR calc Af Amer: >60 mL/min (ref >60)
GFR calc non Af Amer: >60 mL/min (ref >60)
Glucose, Bld: 99 mg/dL (ref 65–99)
POTASSIUM: 4 mmol/L (ref 3.5–5.1)
Sodium: 141 mmol/L (ref 135–145)

## 2017-12-12 LAB — CULTURE, RESPIRATORY: CULTURE: NORMAL

## 2017-12-12 LAB — GLUCOSE, CAPILLARY
GLUCOSE-CAPILLARY: 98 mg/dL (ref 65–99)
GLUCOSE-CAPILLARY: 41 mg/dL — AB (ref 65–99)
GLUCOSE-CAPILLARY: 64 mg/dL — AB (ref 65–99)
Glucose-Capillary: 182 mg/dL — ABNORMAL HIGH (ref 65–99)
Glucose-Capillary: 101 mg/dL — ABNORMAL HIGH (ref 65–99)
Glucose-Capillary: 361 mg/dL — ABNORMAL HIGH (ref 65–99)

## 2017-12-12 LAB — CULTURE, RESPIRATORY W GRAM STAIN

## 2017-12-12 LAB — ECHOCARDIOGRAM COMPLETE
HEIGHTINCHES: 63 in
WEIGHTICAEL: 2183.44 [oz_av]

## 2017-12-12 MED ORDER — FUROSEMIDE 10 MG/ML IJ SOLN: 20.0000 mg | Freq: Two times a day (BID) | INTRAMUSCULAR | Status: DC

## 2017-12-12 MED ORDER — ALBUTEROL SULFATE (2.5 MG/3ML) 0.083% IN NEBU: 2.5000 mg | INHALATION_SOLUTION | RESPIRATORY_TRACT | Status: DC | PRN

## 2017-12-12 MED ORDER — INSULIN GLARGINE 100 UNIT/ML ~~LOC~~ SOLN
35.0000 [IU] | Freq: Two times a day (BID) | SUBCUTANEOUS | Status: DC
  Administered 2017-12-12 – 2017-12-13 (×2): 35 [IU] via SUBCUTANEOUS
  Filled 2017-12-12 (×2): qty 0.35

## 2017-12-12 MED ORDER — IPRATROPIUM-ALBUTEROL 0.5-2.5 (3) MG/3ML IN SOLN
3.0000 mL | Freq: Three times a day (TID) | RESPIRATORY_TRACT | Status: DC
  Administered 2017-12-12 – 2017-12-13 (×2): 3 mL via RESPIRATORY_TRACT
  Filled 2017-12-12 (×3): qty 3

## 2017-12-12 MED ORDER — FUROSEMIDE 10 MG/ML IJ SOLN
20.0000 mg | Freq: Two times a day (BID) | INTRAMUSCULAR | Status: DC
  Administered 2017-12-12: 20 mg via INTRAVENOUS
  Filled 2017-12-12: qty 2

## 2017-12-12 MED ORDER — POTASSIUM CHLORIDE CRYS ER 20 MEQ PO TBCR
40.0000 meq | EXTENDED_RELEASE_TABLET | Freq: Two times a day (BID) | ORAL | Status: AC
  Administered 2017-12-12 (×2): 40 meq via ORAL
  Filled 2017-12-12 (×2): qty 2

## 2017-12-12 MED ORDER — FUROSEMIDE 10 MG/ML IJ SOLN
40.0000 mg | Freq: Two times a day (BID) | INTRAMUSCULAR | Status: AC
  Administered 2017-12-12: 40 mg via INTRAVENOUS
  Filled 2017-12-12: qty 4

## 2017-12-12 NOTE — Progress Notes (Signed)
[facility] PROGRESS NOTE    Progress Note  Antonio Martin  ZOX:[id number] DOB: 11/25/1990 DOA: 12/09/2017 PCP: Patient, No Pcp Per     Brief Narrative:   Antonio Martin is an 27 y.o. male patient Tigringa speaking past medical history is mellitus type I comes in with fever productive cough and generalized malaise that started 1 week prior to admission.  Assessment/Plan:   Acute respiratory failure with hypoxia (HCC) due to RSV/possible sepsis due to strep pneumo He was started empirically on IV Rocephin and azithromycin, urine pneumo and RSV panel positive. His antibiotic regimen was de-escalated to oral Levaquin, he has remained afebrile.   Type 1 diabetes mellitus (HCC) Hypoglycemic this morning we will decrease Lantus continue sliding scale insulin.   Acute kidney injury (HCC) Baseline creatinine is less than 1. Likely prerenal in etiology, resolved with IV fluid hydration.  Essential hypertension To hold ACE inhibitor, blood pressure is improved.  Orthopnea lower extremity edema and JVD Now with new symptoms of what it seems acute decompensated heart failure check a 2D echo, start him on IV Lasix oral potassium follow strict I's and O's Daily weight.  DVT prophylaxis: lovenox Family Communication:none Disposition Plan/Barrier to D/C: Home in 2-3 days. Code Status:     Code Status Orders  (From admission, onward)        Start     Ordered   12/10/17 0415  Full code  Continuous     12/10/17 0418    Code Status History    Date Active Date Inactive Code Status Order ID Comments User Context   This patient has a current code status but no historical code status.        IV Access:    Peripheral IV   Procedures and diagnostic studies:   No results found.   Medical Consultants:    None.  Anti-Infectives:   IV Rocephin and azithromycin.  Subjective:    Antonio Martin patient relates orthopnea and lower extremity edema with  persistent cough every time he tries to lay down  Objective:    Vitals:   12/11/17 2057 12/11/17 2242 12/12/17 0538 12/12/17 0745  BP:  (!) 159/81 (!) 157/84   Pulse:  91 83   Resp:  18 (!) 22   Temp:  (!) 97.5 F (36.4 C) 98.2 F (36.8 C)   TempSrc:  Oral Oral   SpO2: 96% 96% 95% 97%  Weight:      Height:        Intake/Output Summary (Last 24 hours) at 12/12/2017 0951 Last data filed at 12/11/2017 1800 Gross per 24 hour  Intake 360 ml  Output -  Net 360 ml   Filed Weights   12/10/17 1345  Weight: 61.9 kg (136 lb 7.4 oz)    Exam: General exam: In no acute distress. Respiratory system: He has poor air movement and rhonchi bilaterally. Cardiovascular system: S1 & S2 heard, RRR.  Positive JVD Gastrointestinal system: Abdomen is nondistended, soft and nontender.  Central nervous system: Alert and oriented. No focal neurological deficits. Extremities: Lower extremity edema.    Data Reviewed:    Labs: Basic Metabolic Panel: Recent Labs  Lab 12/09/17 1832 12/10/17 0430 12/11/17 0526 12/12/17 0536  NA 135 138 140 141  K 4.0 3.7 4.4 4.0  CL 104 107 114* 112*  CO2 24 20* 20* 18*  GLUCOSE 276* 305* 227* 99  BUN 23* 24* 15 19  CREATININE 1.48* 1.58* 1.03 1.07  CALCIUM 8.9 7.8* 8.4* 8.7*  GFR Estimated Creatinine Clearance: 83.5 mL/min (by C-G formula based on SCr of 1.07 mg/dL). Liver Function Tests: No results for input(s): AST, ALT, ALKPHOS, BILITOT, PROT, ALBUMIN in the last 168 hours. No results for input(s): LIPASE, AMYLASE in the last 168 hours. No results for input(s): AMMONIA in the last 168 hours. Coagulation profile No results for input(s): INR, PROTIME in the last 168 hours.  CBC: Recent Labs  Lab 12/09/17 1832 12/10/17 0430  WBC 12.4* 13.6*  HGB 13.6 11.1*  HCT 38.1* 32.2*  MCV 85.8 87.0  PLT 272 239   Cardiac Enzymes: No results for input(s): CKTOTAL, CKMB, CKMBINDEX, TROPONINI in the last 168 hours. BNP (last 3 results) No results  for input(s): PROBNP in the last 8760 hours. CBG: Recent Labs  Lab 12/11/17 2239 12/11/17 2333 12/12/17 0546 12/12/17 0735 12/12/17 0801  GLUCAP 64* 132* 98 41* 64*   D-Dimer: No results for input(s): DDIMER in the last 72 hours. Hgb A1c: No results for input(s): HGBA1C in the last 72 hours. Lipid Profile: No results for input(s): CHOL, HDL, LDLCALC, TRIG, CHOLHDL, LDLDIRECT in the last 72 hours. Thyroid function studies: No results for input(s): TSH, T4TOTAL, T3FREE, THYROIDAB in the last 72 hours.  Invalid input(s): FREET3 Anemia work up: No results for input(s): VITAMINB12, FOLATE, FERRITIN, TIBC, IRON, RETICCTPCT in the last 72 hours. Sepsis Labs: Recent Labs  Lab 12/09/17 1832 12/10/17 0430 12/10/17 0755  PROCALCITON  --  0.45  --   WBC 12.4* 13.6*  --   LATICACIDVEN  --  4.4* 2.1*   Microbiology Recent Results (from the past 240 hour(s))  Respiratory Panel by PCR     Status: Abnormal   Collection Time: 12/10/17  1:13 AM  Result Value Ref Range Status   Adenovirus NOT DETECTED NOT DETECTED Final   Coronavirus 229E NOT DETECTED NOT DETECTED Final   Coronavirus HKU1 NOT DETECTED NOT DETECTED Final   Coronavirus NL63 NOT DETECTED NOT DETECTED Final   Coronavirus OC43 NOT DETECTED NOT DETECTED Final   Metapneumovirus NOT DETECTED NOT DETECTED Final   Rhinovirus / Enterovirus NOT DETECTED NOT DETECTED Final   Influenza A NOT DETECTED NOT DETECTED Final   Influenza B NOT DETECTED NOT DETECTED Final   Parainfluenza Virus 1 NOT DETECTED NOT DETECTED Final   Parainfluenza Virus 2 NOT DETECTED NOT DETECTED Final   Parainfluenza Virus 3 NOT DETECTED NOT DETECTED Final   Parainfluenza Virus 4 NOT DETECTED NOT DETECTED Final   Respiratory Syncytial Virus DETECTED (A) NOT DETECTED Final    Comment: CRITICAL RESULT CALLED TO, READ BACK BY AND VERIFIED WITH: R BARHAM,RN AT 1230 12/10/17 BY L BENFIELD    Bordetella pertussis NOT DETECTED NOT DETECTED Final   Chlamydophila  pneumoniae NOT DETECTED NOT DETECTED Final   Mycoplasma pneumoniae NOT DETECTED NOT DETECTED Final    Comment: Performed at [facility], [address] [place], [place] [address]  Blood culture (routine x 2)     Status: None (Preliminary result)   Collection Time: 12/10/17  3:20 AM  Result Value Ref Range Status   Specimen Description   Final    BLOOD RIGHT HAND Performed at [facility], [address] [place], [place] [address]    Special Requests   Final    BOTTLES DRAWN AEROBIC AND ANAEROBIC Blood Culture adequate volume Performed at [facility], [address] [place], [place] [address]    Culture   Final    NO GROWTH 1 DAY  Performed at [facility], [address], [place], [place] [address]    Report Status PENDING  Incomplete  Blood culture (routine x 2)     Status: None (Preliminary result)   Collection Time: 12/10/17  3:25 AM  Result Value Ref Range Status   Specimen Description   Final    BLOOD RIGHT HAND Performed at [facility], [address], [place], [place] [address]    Special Requests   Final    BOTTLES DRAWN AEROBIC AND ANAEROBIC Blood Culture adequate volume Performed at [facility], [address], [place], [place] [address]    Culture   Final    NO GROWTH 1 DAY Performed at [facility], [address] [place], [place] [address]    Report Status PENDING  Incomplete  Culture, sputum-assessment     Status: None   Collection Time: 12/10/17  7:55 AM  Result Value Ref Range Status   Specimen Description SPUTUM  Final   Special Requests NONE  Final   Sputum evaluation   Final    THIS SPECIMEN IS ACCEPTABLE FOR SPUTUM CULTURE Performed at [facility], [address] [place], [place] [address]    Report Status 12/10/2017 FINAL  Final  Gram stain     Status: None (Preliminary result)   Collection Time: 12/10/17  7:55 AM  Result Value  Ref Range Status   Specimen Description TRACHEAL SITE  Final   Special Requests NONE  Final   Gram Stain   Final    ABUNDANT WBC PRESENT,BOTH PMN AND MONONUCLEAR GRAM POSITIVE COCCI IN PAIRS GRAM NEGATIVE RODS GRAM POSITIVE RODS CRITICAL RESULT CALLED TO, READ BACK BY AND VERIFIED WITH: Q.MILLNER,RN 12/10/17 @1035  BY V.WILKINS CONFIRMED BY J.ROCK Performed at [facility], [address] [place], [place] [address]    Report Status PENDING  Incomplete  Culture, respiratory (NON-Expectorated)     Status: None (Preliminary result)   Collection Time: 12/10/17  7:55 AM  Result Value Ref Range Status   Specimen Description   Final    SPUTUM Performed at [facility], [address], [place], [place] [address]    Special Requests   Final    NONE Reflexed from [id number] Performed at [facility], [address] [place], [place] [address]    Gram Stain   Final    ABUNDANT WBC PRESENT, PREDOMINANTLY PMN ABUNDANT GRAM POSITIVE COCCI ABUNDANT GRAM POSITIVE RODS FEW GRAM NEGATIVE RODS RARE YEAST    Culture   Final    CULTURE REINCUBATED FOR BETTER GROWTH Performed at [facility], [address] [place], [place] [address]    Report Status PENDING  Incomplete     Medications:   . enoxaparin (LOVENOX) injection  40 mg Subcutaneous Daily  . guaiFENesin  600 mg Oral BID  . insulin aspart  0-20 Units Subcutaneous TID WC  . insulin aspart  0-5 Units Subcutaneous QHS  . insulin glargine  50 Units Subcutaneous BID  . ipratropium-albuterol  3 mL Nebulization TID  . levofloxacin  500 mg Oral QHS  . methylPREDNISolone (SOLU-MEDROL) injection  40 mg Intravenous Q12H   Continuous Infusions:     LOS: 2 days   Marinda Elk  [facility] Pager [phone]  *Please refer to [email or web address], password [email or web address] to get updated schedule on who will round on this patient, as hospitalists switch teams weekly. If 7PM-7AM, please  contact night-coverage at [email or web address],  password [email or web address] for any overnight needs.  12/12/2017, 9:51 AM

## 2017-12-12 NOTE — Progress Notes (Signed)
  Echocardiogram 2D Echocardiogram has been performed.  Celene SkeenVijay  Ahuva Poynor 12/12/2017, 2:50 PM

## 2017-12-12 NOTE — Progress Notes (Signed)
Nutrition Brief Note  Patient identified on the Malnutrition Screening Tool (MST) Report  Wt Readings from Last 15 Encounters:  12/10/17 136 lb 7.4 oz (61.9 kg)  11/13/17 127 lb 13.9 oz (58 kg)  12/10/16 130 lb 1.1 oz (59 kg)  11/09/16 128 lb (58.1 kg)  05/30/16 126 lb (57.2 kg)  01/14/16 135 lb 12.8 oz (61.6 kg)  12/01/15 134 lb (60.8 kg)  09/03/15 127 lb (57.6 kg)  08/13/15 125 lb (56.7 kg)  07/30/15 128 lb (58.1 kg)  06/16/15 153 lb (69.4 kg)    Body mass index is 24.17 kg/m. Patient meets criteria for normal based on current BMI. Per weight records, pt's weight has trending up or remained stable over the past 2 years.   Current diet order is carb modified, patient is consuming approximately 75% of meals at this time. Discussed pt with RN, pt dislikes the "American" food available. Pt ate ~75% of breakfast consisting of omelet, grapes, toast and beverages.   Labs and medications reviewed.   No nutrition interventions warranted at this time. If nutrition issues arise, please consult RD.   Fransisca KaufmannAllison Ioannides, MS, RDN, LDN 12/12/2017 9:42 AM

## 2017-12-12 NOTE — Progress Notes (Addendum)
Hypoglycemic Event  CBG: 64  Treatment: 15 GM carbohydrate snack  Symptoms: None  Follow-up CBG: Time: 23:33 CBG Result: 132  Possible Reasons for Event: Inadequate meal intake  Comments/MD notified: NP on call, [place], notified     Manson PasseyBrown, [employer]wn Cherie

## 2017-12-12 NOTE — Progress Notes (Signed)
Inpatient Diabetes Program Recommendations  AACE/ADA: New Consensus Statement on Inpatient Glycemic Control (2015)  Target Ranges:  Prepandial:   less than 140 mg/dL      Peak postprandial:   less than 180 mg/dL (1-2 hours)      Critically ill patients:  140 - 180 mg/dL   Lab Results  Component Value Date   GLUCAP 64 (L) 12/12/2017   HGBA1C 7.10 12/01/2015    Review of Glycemic ControlResults for [name], Antonio (MRN [id number]) as of 12/12/2017 09:42  Ref. Range 12/11/2017 17:11 12/11/2017 22:39 12/11/2017 23:33 12/12/2017 05:46 12/12/2017 07:35 12/12/2017 08:01  Glucose-Capillary Latest Ref Range: 65 - 99 mg/dL 409163 (H) 64 (L) 811132 (H) 98 41 (LL) 64 (L)    Diabetes history: Type 1 DM Outpatient Diabetes medications: Novolog 12 units tid with meals, Toujeo 40 units q HS Current orders for Inpatient glycemic control:  Solumedrol 40 mg IV q 12 hours Lantus 50 units bid, Novolog resistant tid with meals and HS  Inpatient Diabetes Program Recommendations:    Note low blood sugars.  Please reduce Lantus to 45 units once a day.  Also consider reducing Novolog correction to moderate tid with meals and HS and add Novolog meal coverage 4 units tid with meals.  Text page sent to MD.  Thanks,  Beryl MeagerJenny Julietta Batterman, RN, BC-ADM Inpatient Diabetes Coordinator Pager [phone] (8a-5p)

## 2017-12-12 NOTE — Progress Notes (Signed)
Inpatient Diabetes Program Recommendations  AACE/ADA: New Consensus Statement on Inpatient Glycemic Control (2015)  Target Ranges:  Prepandial:   less than 140 mg/dL      Peak postprandial:   less than 180 mg/dL (1-2 hours)      Critically ill patients:  140 - 180 mg/dL   Lab Results  Component Value Date   GLUCAP 101 (H) 12/12/2017   HGBA1C 7.10 12/01/2015    Review of Glycemic Control  Has long hx hypoglycemia dating back to 2016.  HgbA1C from 12/01/2015 - 7.1%. Needs update On Solumedrol 40 mg Q12H CBGs today - 98, 41, 64, 182, 101. No Lantus since 2/17 at 10am. Pt eating 75-100% meals. Type 1 DM. Needs meal coverage insulin.  Inpatient Diabetes Program Recommendations:    Decrease Lantus to 40 units once/day. Home dose. Decrease Novolog to 0-15 units tidwc and hs Add meal coverage insulin - Novolog 4 units tidwc with meals Need updated HgbA1C  Text page sent to MD regarding above. Will continue to follow.  Thank you. Ailene Ardshonda Noele Icenhour, RD, LDN, CDE Inpatient Diabetes Coordinator [phone]

## 2017-12-12 NOTE — Progress Notes (Signed)
Patient unable to lay flat due to uncontrollable coughing spells. Patient continues with productive cough and  Rhonchi/ coarse lung sounds.

## 2017-12-13 LAB — GRAM STAIN

## 2017-12-13 LAB — GLUCOSE, CAPILLARY
Glucose-Capillary: 42 mg/dL — CL (ref 65–99)
Glucose-Capillary: 205 mg/dL — ABNORMAL HIGH (ref 65–99)

## 2017-12-13 LAB — BASIC METABOLIC PANEL
Anion gap: 10 (ref 5–15)
BUN: 25 mg/dL — AB (ref 6–20)
CHLORIDE: 108 mmol/L (ref 101–111)
CO2: 21 mmol/L — AB (ref 22–32)
CREATININE: 1.16 mg/dL (ref 0.61–1.24)
Calcium: 8.7 mg/dL — ABNORMAL LOW (ref 8.9–10.3)
GFR calc Af Amer: >60 mL/min (ref >60)
GFR calc non Af Amer: >60 mL/min (ref >60)
Glucose, Bld: 107 mg/dL — ABNORMAL HIGH (ref 65–99)
POTASSIUM: 3.6 mmol/L (ref 3.5–5.1)
Sodium: 139 mmol/L (ref 135–145)

## 2017-12-13 MED ORDER — IPRATROPIUM-ALBUTEROL 0.5-2.5 (3) MG/3ML IN SOLN: 3.0000 mL | Freq: Two times a day (BID) | RESPIRATORY_TRACT | Status: DC

## 2017-12-13 MED ORDER — PREDNISONE 10 MG PO TABS: ORAL_TABLET | ORAL | 0 refills | Status: AC

## 2017-12-13 MED ORDER — LEVOFLOXACIN 500 MG PO TABS: 500.0000 mg | ORAL_TABLET | Freq: Every day | ORAL | 0 refills | Status: DC

## 2017-12-13 NOTE — Care Management Note (Signed)
Case Management Note  Patient Details  Name: Antonio Martin MRN: [id number] Date of Birth: 08/24/1991  Subjective/Objective:                    Action/Plan:d/c home   Expected Discharge Date:  12/13/17               Expected Discharge Plan:  Home/Self Care  In-House Referral:     Discharge planning Services  CM Consult  Post Acute Care Choice:    Choice offered to:     DME Arranged:    DME Agency:     HH Arranged:    HH Agency:     Status of Service:  Completed, signed off  If discussed at [employer] Length of Stay Meetings, dates discussed:    Additional Comments:  Lanier ClamMahabir, Clairessa Boulet, RN 12/13/2017, 11:37 AM

## 2017-12-13 NOTE — Progress Notes (Signed)
Hypoglycemic Event  CBG: 42  Treatment: 15 GM carbohydrate snack (juice plus breakfast)  Symptoms: None  Follow-up CBG: Time: 0913 CBG Result: 205  Possible Reasons for Event: Unknown  Comments/MD notified: Dr. David StallFeliz Ortiz  Pt. Initial CBG was checked at 0747, pt. Began to drink a fruit smoothie drink and ordered breakfast.  NT attempted to recheck CBG within 30 minutes but patient refused stating he wanted to have it checked after eating breakfast which is why there is a gap between readings.      Antonio Martin, [name]

## 2017-12-13 NOTE — Discharge Summary (Signed)
Physician Discharge Summary  Dawaun Brancato FAO:[id number] DOB: 1990-11-20 DOA: 12/09/2017  PCP: Patient, No Pcp Per  Admit date: 12/09/2017 Discharge date: 12/13/2017  Admitted From: Home Disposition:  Home  Recommendations for Outpatient Follow-up:  1. Follow up with PCP in 1-2 weeks 2. Please obtain BMP/CBC in one week   Home Health:No Equipment/Devices:none  Discharge Condition:stable CODE STATUS:full Diet recommendation: Heart Healthy   Brief/Interim Summary: 27 y.o. male patient Tigringa speaking past medical history is mellitus type I comes in with fever productive cough and generalized malaise that started 1 week prior to admission.    Discharge Diagnoses:  Principal Problem:   Acute respiratory failure with hypoxia (HCC) Active Problems:   Type 1 diabetes mellitus (HCC)   Community acquired pneumonia   Acute kidney injury (HCC)   Essential hypertension   Severe sepsis (HCC)  Acute respiratory failure with hypoxia due to RSV/possible pneumonia due to strep pneumo: Started empirically on IV Rocephin and azithro urine strep pneumo was positive his virus panel was also positive for RSV. He defervesced his respiration improved he was changed to oral Levaquin which she will continue for 3 more days. On admission he was fluid resuscitated and by the second day he was complaining of orthopnea a 2D echo was done that showed a preserved EF with no valve abnormalities and no diastolic dysfunction he was given a single dose of Lasix which she diuresed well.  Diabetes mellitus type 1: No changes were made to his medication.  Acute kidney injury: Baseline creatinine of less than is 1, on admission it was high likely prerenal in the setting of ACE inhibitor use, resolved with IV fluid hydration.  Essential hypertension: His blood pressure medications were held on admission due to sepsis and hypotension but this resolved no changes made to his medication. Discharge  Instructions  Discharge Instructions    Diet - low sodium heart healthy   Complete by:  As directed    Increase activity slowly   Complete by:  As directed      Allergies as of 12/13/2017      Reactions   Tape Rash      Medication List    STOP taking these medications   ondansetron 4 MG disintegrating tablet Commonly known as:  ZOFRAN ODT     TAKE these medications   albuterol 108 (90 Base) MCG/ACT inhaler Commonly known as:  PROVENTIL HFA;VENTOLIN HFA Inhale 1-2 puffs into the lungs every 6 (six) hours as needed for wheezing or shortness of breath.   benzonatate 100 MG capsule Commonly known as:  TESSALON Take 1 capsule (100 mg total) by mouth every 8 (eight) hours.   levofloxacin 500 MG tablet Commonly known as:  LEVAQUIN Take 1 tablet (500 mg total) by mouth at bedtime.   lisinopril 10 MG tablet Commonly known as:  PRINIVIL,ZESTRIL Take 10 mg by mouth daily.   NOVOLOG FLEXPEN 100 UNIT/ML FlexPen Generic drug:  insulin aspart Inject 12 Units into the skin 3 (three) times daily before meals. Per sliding scale   predniSONE 10 MG tablet Commonly known as:  DELTASONE Takes 3 tablets for 1 days, then 2 tabs for 1 days, then 1 tab for 1 days, and then stop.   TOUJEO SOLOSTAR 300 UNIT/ML Sopn Generic drug:  Insulin Glargine Inject 40 Units into the skin at bedtime.       Allergies  Allergen Reactions  . Tape Rash    Consultations:  None   Procedures/Studies: Dg Chest 2 View  Result  Date: 12/09/2017 CLINICAL DATA:  Cough EXAM: CHEST  2 VIEW COMPARISON:  12/07/2017 FINDINGS: The heart size and mediastinal contours are within normal limits. Both lungs are clear. The visualized skeletal structures are unremarkable. IMPRESSION: No active cardiopulmonary disease. Electronically Signed   By: Kennith Center M.D.   On: 12/09/2017 19:13   Dg Chest 2 View  Result Date: 12/07/2017 CLINICAL DATA:  27 year old male with history of hypoglycemia and cough. Altered  mental status. EXAM: CHEST  2 VIEW COMPARISON:  Chest x-ray 11/13/2017. FINDINGS: Lung volumes are normal. No consolidative airspace disease. No pleural effusions. No pneumothorax. No pulmonary nodule or mass noted. Pulmonary vasculature and the cardiomediastinal silhouette are within normal limits. IMPRESSION: No radiographic evidence of acute cardiopulmonary disease. Electronically Signed   By: Trudie Reed M.D.   On: 12/07/2017 19:45   Dg Chest Port 1 View  Result Date: 12/10/2017 CLINICAL DATA:  Cough and fever for 3 days. EXAM: PORTABLE CHEST 1 VIEW COMPARISON:  Chest radiograph December 09, 2017 FINDINGS: Cardiomediastinal silhouette is normal. Pulmonary vascular shadows accentuated by AP technique. No pleural effusions or focal consolidations. Trachea projects midline and there is no pneumothorax. Soft tissue planes and included osseous structures are non-suspicious. IMPRESSION: No active disease. Electronically Signed   By: Awilda Metro M.D.   On: 12/10/2017 06:03   Dg Chest Port 1 View  Result Date: 11/13/2017 CLINICAL DATA:  Right lower quadrant pain, nausea vomiting. EXAM: PORTABLE CHEST 1 VIEW COMPARISON:  07/29/2015 FINDINGS: Cardiomediastinal silhouette is normal. Mediastinal contours appear intact. There is no evidence of focal airspace consolidation, pleural effusion or pneumothorax. Osseous structures are without acute abnormality. Soft tissues are grossly normal. IMPRESSION: No active disease. Electronically Signed   By: Ted Mcalpine M.D.   On: 11/13/2017 18:42    Subjective:  No complaints able to sleep flat Discharge Exam: Vitals:   12/13/17 0048 12/13/17 0532  BP: (!) 150/74 (!) 143/87  Pulse: 96 86  Resp:  18  Temp:  98.7 F (37.1 C)  SpO2:  94%   Vitals:   12/12/17 2015 12/12/17 2237 12/13/17 0048 12/13/17 0532  BP: (!) 177/89 (!) 185/85 (!) 150/74 (!) 143/87  Pulse: 80 79 96 86  Resp: 18   18  Temp: 98.5 F (36.9 C)   98.7 F (37.1 C)  TempSrc:  Oral   Oral  SpO2: 96%   94%  Weight:    59 kg (130 lb)  Height:        General: Pt is alert, awake, not in acute distress Cardiovascular: RRR, S1/S2 +, no rubs, no gallops Respiratory: CTA bilaterally, no wheezing, no rhonchi Abdominal: Soft, NT, ND, bowel sounds + Extremities: no edema, no cyanosis    The results of significant diagnostics from this hospitalization (including imaging, microbiology, ancillary and laboratory) are listed below for reference.     Microbiology: Recent Results (from the past 240 hour(s))  Respiratory Panel by PCR     Status: Abnormal   Collection Time: 12/10/17  1:13 AM  Result Value Ref Range Status   Adenovirus NOT DETECTED NOT DETECTED Final   Coronavirus 229E NOT DETECTED NOT DETECTED Final   Coronavirus HKU1 NOT DETECTED NOT DETECTED Final   Coronavirus NL63 NOT DETECTED NOT DETECTED Final   Coronavirus OC43 NOT DETECTED NOT DETECTED Final   Metapneumovirus NOT DETECTED NOT DETECTED Final   Rhinovirus / Enterovirus NOT DETECTED NOT DETECTED Final   Influenza A NOT DETECTED NOT DETECTED Final   Influenza B NOT DETECTED NOT  DETECTED Final   Parainfluenza Virus 1 NOT DETECTED NOT DETECTED Final   Parainfluenza Virus 2 NOT DETECTED NOT DETECTED Final   Parainfluenza Virus 3 NOT DETECTED NOT DETECTED Final   Parainfluenza Virus 4 NOT DETECTED NOT DETECTED Final   Respiratory Syncytial Virus DETECTED (A) NOT DETECTED Final    Comment: CRITICAL RESULT CALLED TO, READ BACK BY AND VERIFIED WITH: R BARHAM,RN AT 1230 12/10/17 BY L BENFIELD    Bordetella pertussis NOT DETECTED NOT DETECTED Final   Chlamydophila pneumoniae NOT DETECTED NOT DETECTED Final   Mycoplasma pneumoniae NOT DETECTED NOT DETECTED Final    Comment: Performed at [facility], [address] [place], [place] [address]  Blood culture (routine x 2)     Status: None (Preliminary result)   Collection Time: 12/10/17  3:20 AM  Result Value Ref Range Status   Specimen Description    Final    BLOOD RIGHT HAND Performed at [facility], [address] [place], [place] [address]    Special Requests   Final    BOTTLES DRAWN AEROBIC AND ANAEROBIC Blood Culture adequate volume Performed at [facility], [address] [place], [place] [address]    Culture   Final    NO GROWTH 2 DAYS Performed at [facility], [address] [place], [place] [address]    Report Status PENDING  Incomplete  Blood culture (routine x 2)     Status: None (Preliminary result)   Collection Time: 12/10/17  3:25 AM  Result Value Ref Range Status   Specimen Description   Final    BLOOD RIGHT HAND Performed at [facility], [address] [place], [place] [address]    Special Requests   Final    BOTTLES DRAWN AEROBIC AND ANAEROBIC Blood Culture adequate volume Performed at [facility], [address] [place], [place] [address]    Culture   Final    NO GROWTH 2 DAYS Performed at [facility], [address] [place], [place] [address]    Report Status PENDING  Incomplete  Culture, sputum-assessment     Status: None   Collection Time: 12/10/17  7:55 AM  Result Value Ref Range Status   Specimen Description SPUTUM  Final   Special Requests NONE  Final   Sputum evaluation   Final    THIS SPECIMEN IS ACCEPTABLE FOR SPUTUM CULTURE Performed at [facility], [address] [place], [place] [address]    Report Status 12/10/2017 FINAL  Final  Gram stain     Status: None   Collection Time: 12/10/17  7:55 AM  Result Value Ref Range Status   Specimen Description TRACHEAL SITE  Final   Special Requests NONE  Final   Gram Stain   Final    ABUNDANT WBC PRESENT,BOTH PMN AND MONONUCLEAR GRAM POSITIVE COCCI IN PAIRS GRAM NEGATIVE RODS GRAM POSITIVE RODS CRITICAL RESULT CALLED TO, READ BACK BY AND VERIFIED WITH: Q.MILLNER,RN 12/10/17 @1035  BY V.WILKINS CONFIRMED BY J.ROCK Performed at  [facility], [address] [place], [place] [address]    Report Status 12/13/2017 FINAL  Final  Culture, respiratory (NON-Expectorated)     Status: None   Collection Time: 12/10/17  7:55 AM  Result Value Ref Range Status   Specimen Description   Final    SPUTUM Performed at [facility], [address] [place], [place] [address]    Special Requests  Final    NONE Reflexed from [id number] Performed at [facility], [address], [place], [place] [address]    Gram Stain   Final    ABUNDANT WBC PRESENT, PREDOMINANTLY PMN ABUNDANT GRAM POSITIVE COCCI ABUNDANT GRAM POSITIVE RODS FEW GRAM NEGATIVE RODS RARE YEAST    Culture   Final    Consistent with normal respiratory flora. Performed at [facility], [address], [place], [place] [address]    Report Status 12/12/2017 FINAL  Final     Labs: BNP (last 3 results) No results for input(s): BNP in the last 8760 hours. Basic Metabolic Panel: Recent Labs  Lab 12/09/17 1832 12/10/17 0430 12/11/17 0526 12/12/17 0536 12/13/17 0513  NA 135 138 140 141 139  K 4.0 3.7 4.4 4.0 3.6  CL 104 107 114* 112* 108  CO2 24 20* 20* 18* 21*  GLUCOSE 276* 305* 227* 99 107*  BUN 23* 24* 15 19 25*  CREATININE 1.48* 1.58* 1.03 1.07 1.16  CALCIUM 8.9 7.8* 8.4* 8.7* 8.7*   Liver Function Tests: No results for input(s): AST, ALT, ALKPHOS, BILITOT, PROT, ALBUMIN in the last 168 hours. No results for input(s): LIPASE, AMYLASE in the last 168 hours. No results for input(s): AMMONIA in the last 168 hours. CBC: Recent Labs  Lab 12/09/17 1832 12/10/17 0430  WBC 12.4* 13.6*  HGB 13.6 11.1*  HCT 38.1* 32.2*  MCV 85.8 87.0  PLT 272 239   Cardiac Enzymes: No results for input(s): CKTOTAL, CKMB, CKMBINDEX, TROPONINI in the last 168 hours. BNP: Invalid input(s): POCBNP CBG: Recent Labs  Lab 12/12/17 1157 12/12/17 1647 12/12/17 2104 12/13/17 0747 12/13/17 0913  GLUCAP 182*  101* 361* 42* 205*   D-Dimer No results for input(s): DDIMER in the last 72 hours. Hgb A1c No results for input(s): HGBA1C in the last 72 hours. Lipid Profile No results for input(s): CHOL, HDL, LDLCALC, TRIG, CHOLHDL, LDLDIRECT in the last 72 hours. Thyroid function studies No results for input(s): TSH, T4TOTAL, T3FREE, THYROIDAB in the last 72 hours.  Invalid input(s): FREET3 Anemia work up No results for input(s): VITAMINB12, FOLATE, FERRITIN, TIBC, IRON, RETICCTPCT in the last 72 hours. Urinalysis    Component Value Date/Time   COLORURINE STRAW (A) 11/13/2017 1640   APPEARANCEUR CLEAR 11/13/2017 1640   LABSPEC 1.015 11/13/2017 1640   PHURINE 7.0 11/13/2017 1640   GLUCOSEU >=500 (A) 11/13/2017 1640   HGBUR NEGATIVE 11/13/2017 1640   BILIRUBINUR NEGATIVE 11/13/2017 1640   BILIRUBINUR neg 01/14/2016 0955   KETONESUR NEGATIVE 11/13/2017 1640   PROTEINUR 100 (A) 11/13/2017 1640   UROBILINOGEN 0.2 01/14/2016 0955   UROBILINOGEN 0.2 07/29/2015 1805   NITRITE NEGATIVE 11/13/2017 1640   LEUKOCYTESUR NEGATIVE 11/13/2017 1640   Sepsis Labs Invalid input(s): PROCALCITONIN,  WBC,  LACTICIDVEN Microbiology Recent Results (from the past 240 hour(s))  Respiratory Panel by PCR     Status: Abnormal   Collection Time: 12/10/17  1:13 AM  Result Value Ref Range Status   Adenovirus NOT DETECTED NOT DETECTED Final   Coronavirus 229E NOT DETECTED NOT DETECTED Final   Coronavirus HKU1 NOT DETECTED NOT DETECTED Final   Coronavirus NL63 NOT DETECTED NOT DETECTED Final   Coronavirus OC43 NOT DETECTED NOT DETECTED Final   Metapneumovirus NOT DETECTED NOT DETECTED Final   Rhinovirus / Enterovirus NOT DETECTED NOT DETECTED Final   Influenza A NOT DETECTED NOT DETECTED Final   Influenza B NOT DETECTED NOT DETECTED Final   Parainfluenza Virus 1 NOT DETECTED NOT  DETECTED Final   Parainfluenza Virus 2 NOT DETECTED NOT DETECTED Final   Parainfluenza Virus 3 NOT DETECTED NOT DETECTED Final    Parainfluenza Virus 4 NOT DETECTED NOT DETECTED Final   Respiratory Syncytial Virus DETECTED (A) NOT DETECTED Final    Comment: CRITICAL RESULT CALLED TO, READ BACK BY AND VERIFIED WITH: R BARHAM,RN AT 1230 12/10/17 BY L BENFIELD    Bordetella pertussis NOT DETECTED NOT DETECTED Final   Chlamydophila pneumoniae NOT DETECTED NOT DETECTED Final   Mycoplasma pneumoniae NOT DETECTED NOT DETECTED Final    Comment: Performed at [facility], [address], [place], [place] [address]  Blood culture (routine x 2)     Status: None (Preliminary result)   Collection Time: 12/10/17  3:20 AM  Result Value Ref Range Status   Specimen Description   Final    BLOOD RIGHT HAND Performed at [facility], [address], [place], [place] [address]    Special Requests   Final    BOTTLES DRAWN AEROBIC AND ANAEROBIC Blood Culture adequate volume Performed at [facility], [address], [place], [place] [address]    Culture   Final    NO GROWTH 2 DAYS Performed at [facility], [address], [place], [place] [address]    Report Status PENDING  Incomplete  Blood culture (routine x 2)     Status: None (Preliminary result)   Collection Time: 12/10/17  3:25 AM  Result Value Ref Range Status   Specimen Description   Final    BLOOD RIGHT HAND Performed at [facility], [address] [place], [place] [address]    Special Requests   Final    BOTTLES DRAWN AEROBIC AND ANAEROBIC Blood Culture adequate volume Performed at [facility], [address], [place], [place] [address]    Culture   Final    NO GROWTH 2 DAYS Performed at [facility], [address], [place], [place] [address]    Report Status PENDING  Incomplete  Culture, sputum-assessment     Status: None   Collection Time: 12/10/17  7:55 AM  Result Value Ref Range Status   Specimen Description SPUTUM  Final   Special Requests NONE  Final   Sputum  evaluation   Final    THIS SPECIMEN IS ACCEPTABLE FOR SPUTUM CULTURE Performed at [facility], [address] [place] [address] [place], [place] [address]    Report Status 12/10/2017 FINAL  Final  Gram stain     Status: None   Collection Time: 12/10/17  7:55 AM  Result Value Ref Range Status   Specimen Description TRACHEAL SITE  Final   Special Requests NONE  Final   Gram Stain   Final    ABUNDANT WBC PRESENT,BOTH PMN AND MONONUCLEAR GRAM POSITIVE COCCI IN PAIRS GRAM NEGATIVE RODS GRAM POSITIVE RODS CRITICAL RESULT CALLED TO, READ BACK BY AND VERIFIED WITH: Q.MILLNER,RN 12/10/17 @1035  BY V.WILKINS CONFIRMED BY J.ROCK Performed at [facility], [address] [place], [place] [address]    Report Status 12/13/2017 FINAL  Final  Culture, respiratory (NON-Expectorated)     Status: None   Collection Time: 12/10/17  7:55 AM  Result Value Ref Range Status   Specimen Description   Final    SPUTUM Performed at [facility], [address], [place], [place] [address]    Special Requests   Final    NONE Reflexed from [id number] Performed  at [facility], [address] [place], [place] [address]    Gram Stain   Final    ABUNDANT WBC PRESENT, PREDOMINANTLY PMN ABUNDANT GRAM POSITIVE COCCI ABUNDANT GRAM POSITIVE RODS FEW GRAM NEGATIVE RODS RARE YEAST    Culture   Final    Consistent with normal respiratory flora. Performed at [facility], [address] [place], [place] [address]    Report Status 12/12/2017 FINAL  Final     Time coordinating discharge: Over 30 minutes  SIGNED:   Marinda Elk, MD  [facility] 12/13/2017, 9:50 AM Pager   If 7PM-7AM, please contact night-coverage [email or web address] Password [email or web address]

## 2017-12-15 LAB — CULTURE, BLOOD (ROUTINE X 2)
CULTURE: NO GROWTH
Culture: NO GROWTH
Special Requests: ADEQUATE
Special Requests: ADEQUATE

## 2018-06-01 ENCOUNTER — Other Ambulatory Visit: Payer: Self-pay

## 2018-06-01 ENCOUNTER — Emergency Department (HOSPITAL_COMMUNITY)
Admission: EM | Admit: 2018-06-01 | Discharge: 2018-06-01 | Disposition: A | Discharge status: Home / Self Care | Payer: Medicaid Other | Attending: Emergency Medicine | Admitting: Emergency Medicine

## 2018-06-01 ENCOUNTER — Encounter (HOSPITAL_COMMUNITY): Payer: Self-pay | Admitting: Emergency Medicine

## 2018-06-01 ENCOUNTER — Emergency Department (HOSPITAL_COMMUNITY): Payer: Medicaid Other

## 2018-06-01 DIAGNOSIS — Z79899 Other long term (current) drug therapy: Secondary | ICD-10-CM | POA: Diagnosis not present

## 2018-06-01 DIAGNOSIS — R0789 Other chest pain: Secondary | ICD-10-CM | POA: Diagnosis present

## 2018-06-01 DIAGNOSIS — E119 Type 2 diabetes mellitus without complications: Secondary | ICD-10-CM | POA: Insufficient documentation

## 2018-06-01 DIAGNOSIS — I10 Essential (primary) hypertension: Secondary | ICD-10-CM | POA: Insufficient documentation

## 2018-06-01 DIAGNOSIS — Z794 Long term (current) use of insulin: Secondary | ICD-10-CM | POA: Diagnosis not present

## 2018-06-01 MED ORDER — NAPROXEN 500 MG PO TABS: 500.0000 mg | ORAL_TABLET | Freq: Two times a day (BID) | ORAL | 0 refills | Status: DC

## 2018-06-01 NOTE — ED Triage Notes (Signed)
Pt presents to ED for assessment of central chest pain after he was playing soccer yesterday and ran full speed into the side of the goal post.  Patient states he was knocked back, but no LOC.  Denies head injury.  C/o pain to the left hand and his central chest, worse with movement and palpation.  Patient states worse pain with deep inspiration.

## 2018-06-01 NOTE — ED Notes (Signed)
Patient at xray, will bring patient to room when they are done

## 2018-06-01 NOTE — ED Provider Notes (Signed)
[facility] EMERGENC[facility]PART[facility] Provider Note   CSN: [id number] Arrival date & time: 06/01/18  1112     History   Chief Complaint Chief Complaint  Patient presents with  . Chest Pain    HPI Pal Beola CordBerhe is a 27 y.o. male.  HPI   27 year old male presents today with complaints of chest pain. Patient notes he is plain soccer when he ran into a pole. Patient notes immediate pain to the central chest, worse with movement, worse with palpation and worse with deep inspiration. He denies any significant shortness of breath, denies any abdominal pain. Patient notes very minimal pain yesterday to his left hand, no significant pain at the time of my evaluation. No medications prior to arrival other than his insulin.  Past Medical History:  Diagnosis Date  . Diabetes mellitus without complication (HCC)   . Hypertension    new last 2 wks    Patient Active Problem List   Diagnosis Date Noted  . Severe sepsis (HCC) 12/11/2017  . Acute respiratory failure with hypoxia (HCC) 12/10/2017  . Community acquired pneumonia 12/10/2017  . Acute kidney injury (HCC) 12/10/2017  . Essential hypertension 12/10/2017  . Hyperglycemia 07/30/2015  . Type 1 diabetes mellitus (HCC) 07/30/2015    Past Surgical History:  Procedure Laterality Date  . EYE SURGERY    . FINGER SURGERY          Home Medications    Prior to Admission medications   Medication Sig Start Date End Date Taking? Authorizing Provider  albuterol (PROVENTIL HFA;VENTOLIN HFA) 108 (90 Base) MCG/ACT inhaler Inhale 1-2 puffs into the lungs every 6 (six) hours as needed for wheezing or shortness of breath. 12/07/17   Lorre NickAllen, Anthony, MD  benzonatate (TESSALON) 100 MG capsule Take 1 capsule (100 mg total) by mouth every 8 (eight) hours. 12/07/17   Lorre NickAllen, Anthony, MD  insulin aspart (NOVOLOG FLEXPEN) 100 UNIT/ML FlexPen Inject 12 Units into the skin 3 (three) times daily before meals. Per sliding scale    [provider]  levofloxacin (LEVAQUIN) 500 MG tablet Take 1 tablet (500 mg total) by mouth at bedtime. 12/13/17   Marinda ElkFeliz Ortiz, Abraham, MD  lisinopril (PRINIVIL,ZESTRIL) 10 MG tablet Take 10 mg by mouth daily. 11/23/17   [provider]  naproxen (NAPROSYN) 500 MG tablet Take 1 tablet (500 mg total) by mouth 2 (two) times daily. 06/01/18   Latoiya Maradiaga, Tinnie GensJeffrey, PA-C  predniSONE (DELTASONE) 10 MG tablet Takes 3 tablets for 1 days, then 2 tabs for 1 days, then 1 tab for 1 days, and then stop. 12/13/17   Marinda ElkFeliz Ortiz, Abraham, MD  TOUJEO SOLOSTAR 300 UNIT/ML SOPN Inject 40 Units into the skin at bedtime.  03/15/17   [provider]    Family History History reviewed. No pertinent family history.  Social History Social History   Tobacco Use  . Smoking status: Never Smoker  . Smokeless tobacco: Never Used  Substance Use Topics  . Alcohol use: Yes    Comment: occasional   . Drug use: No     Allergies   Tape   Review of Systems Review of Systems  All other systems reviewed and are negative.    Physical Exam Updated Vital Signs BP (!) 149/76 (BP Location: Right Arm)   Pulse 80   Temp 97.8 F (36.6 C) (Oral)   Resp 16   SpO2 100%   Physical Exam  Constitutional: He is oriented to person, place, and time. He appears well-developed and  well-nourished.  HENT:  Head: Normocephalic and atraumatic.  Eyes: Pupils are equal, round, and reactive to light. Conjunctivae are normal. Right eye exhibits no discharge. Left eye exhibits no discharge. No scleral icterus.  Neck: Normal range of motion. No JVD present. No tracheal deviation present.  Cardiovascular: Normal rate, regular rhythm, normal heart sounds and intact distal pulses. Exam reveals no gallop and no friction rub.  No murmur heard. Pulmonary/Chest: Effort normal. No stridor. No respiratory distress. He has no rales. He exhibits tenderness.  Chest wall atraumatic with no bruising, tenderness to palpation of the  sternum  Neurological: He is alert and oriented to person, place, and time. Coordination normal.  Psychiatric: He has a normal mood and affect. His behavior is normal. Judgment and thought content normal.  Nursing note and vitals reviewed.   ED Treatments / Results  Labs (all labs ordered are listed, but only abnormal results are displayed) Labs Reviewed - No data to display  EKG None  EKG normal sinus rhythm with no ST elevation or depression  Radiology Dg Chest 2 View  Result Date: 06/01/2018 CLINICAL DATA:  Chest pain following blunt trauma, initial encounter EXAM: CHEST - 2 VIEW COMPARISON:  12/10/2017 FINDINGS: The heart size and mediastinal contours are within normal limits. Both lungs are clear. The visualized skeletal structures are unremarkable. IMPRESSION: No active cardiopulmonary disease. Electronically Signed   By: Alcide Clever M.D.   On: 06/01/2018 12:39    Procedures Procedures (including critical care time)  Medications Ordered in ED Medications - No data to display   Initial Impression / Assessment and Plan / ED Course  I have reviewed the triage vital signs and the nursing notes.  Pertinent labs & imaging results that were available during my care of the patient were reviewed by me and considered in my medical decision making (see chart for details).     Labs:   Imaging: DG chest 2 view, ED EKG  Consults:  Therapeutics:  Discharge Meds:   Assessment/Plan: 27 year old male presents today with likely chest wall pain. Pain is reproducible on exam, plain films without acute fracture, lung sounds clear, low suspicion for cardiac or pulmonary injury. Patient will be discharged with symptomatic care instructor precautions. He verbalized understanding and agreement to today's plan had no further questions or concerns.    Final Clinical Impressions(s) / ED Diagnoses   Final diagnoses:  Chest wall pain    ED Discharge Orders         Ordered    naproxen  (NAPROSYN) 500 MG tablet  2 times daily     06/01/18 1302           Eyvonne Mechanic, PA-C 06/01/18 1303    Melene Plan, DO 06/01/18 [phone]

## 2018-06-01 NOTE — Discharge Instructions (Addendum)
Please read attached information. If you experience any new or worsening signs or symptoms please return to the emergency room for evaluation. Please follow-up with your primary care provider or specialist as discussed. Please use medication prescribed only as directed and discontinue taking if you have any concerning signs or symptoms.   °

## 2018-12-14 ENCOUNTER — Encounter (HOSPITAL_COMMUNITY): Payer: Self-pay | Admitting: Emergency Medicine

## 2018-12-14 ENCOUNTER — Emergency Department (HOSPITAL_COMMUNITY)
Admission: EM | Admit: 2018-12-14 | Discharge: 2018-12-14 | Disposition: A | Discharge status: Home / Self Care | Payer: Medicaid Other | Attending: Emergency Medicine | Admitting: Emergency Medicine

## 2018-12-14 ENCOUNTER — Other Ambulatory Visit: Payer: Self-pay

## 2018-12-14 DIAGNOSIS — Z79899 Other long term (current) drug therapy: Secondary | ICD-10-CM | POA: Diagnosis not present

## 2018-12-14 DIAGNOSIS — R739 Hyperglycemia, unspecified: Secondary | ICD-10-CM

## 2018-12-14 DIAGNOSIS — E1165 Type 2 diabetes mellitus with hyperglycemia: Secondary | ICD-10-CM | POA: Diagnosis not present

## 2018-12-14 DIAGNOSIS — I10 Essential (primary) hypertension: Secondary | ICD-10-CM | POA: Diagnosis not present

## 2018-12-14 DIAGNOSIS — Z794 Long term (current) use of insulin: Secondary | ICD-10-CM | POA: Insufficient documentation

## 2018-12-14 DIAGNOSIS — J069 Acute upper respiratory infection, unspecified: Secondary | ICD-10-CM

## 2018-12-14 LAB — CBC WITH DIFFERENTIAL/PLATELET
ABS IMMATURE GRANULOCYTES: 0.01 10*3/uL (ref 0.00–0.07)
BASOS PCT: 1 %
Basophils Absolute: 0 10*3/uL (ref 0.0–0.1)
EOS PCT: 10 %
Eosinophils Absolute: 0.5 10*3/uL (ref 0.0–0.5)
HCT: 38.3 % — ABNORMAL LOW (ref 39.0–52.0)
HEMOGLOBIN: 13.2 g/dL (ref 13.0–17.0)
Immature Granulocytes: 0 %
Lymphocytes Relative: 22 %
Lymphs Abs: 1 10*3/uL (ref 0.7–4.0)
MCH: 29.9 pg (ref 26.0–34.0)
MCHC: 34.5 g/dL (ref 30.0–36.0)
MCV: 86.8 fL (ref 80.0–100.0)
MONO ABS: 0.2 10*3/uL (ref 0.1–1.0)
MONOS PCT: 5 %
Neutro Abs: 2.7 10*3/uL (ref 1.7–7.7)
Neutrophils Relative %: 62 %
PLATELETS: 187 10*3/uL (ref 150–400)
RBC: 4.41 MIL/uL (ref 4.22–5.81)
RDW: 11.4 % — ABNORMAL LOW (ref 11.5–15.5)
WBC: 4.4 10*3/uL (ref 4.0–10.5)
nRBC: 0 % (ref 0.0–0.2)

## 2018-12-14 LAB — BASIC METABOLIC PANEL
Anion gap: 11 (ref 5–15)
BUN: 30 mg/dL — AB (ref 6–20)
CO2: 19 mmol/L — ABNORMAL LOW (ref 22–32)
CREATININE: 1.61 mg/dL — AB (ref 0.61–1.24)
Calcium: 8.3 mg/dL — ABNORMAL LOW (ref 8.9–10.3)
Chloride: 101 mmol/L (ref 98–111)
GFR calc Af Amer: >60 mL/min (ref >60)
GFR calc non Af Amer: 57 mL/min — ABNORMAL LOW (ref >60)
GLUCOSE: 601 mg/dL — AB (ref 70–99)
Potassium: 5.6 mmol/L — ABNORMAL HIGH (ref 3.5–5.1)
SODIUM: 131 mmol/L — AB (ref 135–145)

## 2018-12-14 LAB — CBG MONITORING, ED: Glucose-Capillary: 364 mg/dL — ABNORMAL HIGH (ref 70–99)

## 2018-12-14 LAB — LACTIC ACID, PLASMA: LACTIC ACID, VENOUS: 1 mmol/L (ref 0.5–1.9)

## 2018-12-14 MED ORDER — SODIUM CHLORIDE 0.9 % IV BOLUS
1000.0000 mL | Freq: Once | INTRAVENOUS | Status: AC
  Administered 2018-12-14: 1000 mL via INTRAVENOUS

## 2018-12-14 MED ORDER — INSULIN ASPART 100 UNIT/ML ~~LOC~~ SOLN
10.0000 [IU] | Freq: Once | SUBCUTANEOUS | Status: AC
  Administered 2018-12-14: 10 [IU] via INTRAVENOUS

## 2018-12-14 MED ORDER — AZITHROMYCIN 250 MG PO TABS: 250.0000 mg | ORAL_TABLET | Freq: Every day | ORAL | 0 refills | Status: DC

## 2018-12-14 NOTE — ED Notes (Signed)
Checked patient cbg it was 51 notified Dr Judd Lien of blood sugar patient is resting with call bell in reach

## 2018-12-14 NOTE — ED Triage Notes (Signed)
Per [facility] pt coming from home c/o hyperglycemia. Unsure if insulin pump working correctly. Adds cough for 1 week. CBG initially read High given fluids and now reading 565.

## 2018-12-14 NOTE — ED Notes (Signed)
This RN discussed with patient the importance of blood draw, and the importance of cooperating with healthcare providers.  Patient verbalized understanding that though he has  the choice  to stay and receive care, he also cannot be kept against his will.  Patient has opt to stay and be a partner in his healthcare. This RN will draw blood

## 2018-12-14 NOTE — ED Notes (Signed)
Pt refused to put gown stated its to cold. Pt requesting water informed him that the doctor would have to see him first. Pt stated hes taking to long.

## 2018-12-14 NOTE — Discharge Instructions (Addendum)
Zithromax as prescribed.  Continue your insulin pump as before.  Keep a record of your blood sugars and take this with you to your next doctor's appointment.  Return to the ER if symptoms significantly worsen or change.

## 2018-12-14 NOTE — ED Triage Notes (Signed)
PT refuses lab s to be drawn by Lab tech Marylene Land .

## 2018-12-14 NOTE — ED Provider Notes (Signed)
[facility] EMERGENCY DEPARTMENT Provider Note   CSN: [id number] Arrival date & time: 12/14/18  1038    History   Chief Complaint Chief Complaint  Patient presents with  . Hyperglycemia  . Cough    HPI Antonio Martin is a 28 y.o. male.     Patient is a 28 year old male with past medical history of type 1 diabetes, hypertension.  He presents today for evaluation of elevated blood sugar.  Patient has an insulin pump which he is not sure it is functioning properly.  He felt poorly this morning, then checked his blood sugar and it read high.  He was brought by ambulance for evaluation of this.  He denies any specific complaints such as abdominal pain, nausea, or vomiting.  He denies any fevers.  He denies any chest pain or cough.  The history is provided by the patient.  Hyperglycemia  Blood sugar level PTA:  "high" Onset quality:  Sudden Timing:  Constant Progression:  Unchanged Chronicity:  New Diabetes status:  Controlled with insulin Relieved by:  Nothing Ineffective treatments:  None tried Cough    Past Medical History:  Diagnosis Date  . Diabetes mellitus without complication (HCC)   . Hypertension    new last 2 wks    Patient Active Problem List   Diagnosis Date Noted  . Severe sepsis (HCC) 12/11/2017  . Acute respiratory failure with hypoxia (HCC) 12/10/2017  . Community acquired pneumonia 12/10/2017  . Acute kidney injury (HCC) 12/10/2017  . Essential hypertension 12/10/2017  . Hyperglycemia 07/30/2015  . Type 1 diabetes mellitus (HCC) 07/30/2015    Past Surgical History:  Procedure Laterality Date  . EYE SURGERY    . FINGER SURGERY          Home Medications    Prior to Admission medications   Medication Sig Start Date End Date Taking? Authorizing Provider  albuterol (PROVENTIL HFA;VENTOLIN HFA) 108 (90 Base) MCG/ACT inhaler Inhale 1-2 puffs into the lungs every 6 (six) hours as needed for wheezing or shortness of breath.  12/07/17   Lorre Nick, MD  benzonatate (TESSALON) 100 MG capsule Take 1 capsule (100 mg total) by mouth every 8 (eight) hours. 12/07/17   Lorre Nick, MD  insulin aspart (NOVOLOG FLEXPEN) 100 UNIT/ML FlexPen Inject 12 Units into the skin 3 (three) times daily before meals. Per sliding scale    [provider]  levofloxacin (LEVAQUIN) 500 MG tablet Take 1 tablet (500 mg total) by mouth at bedtime. 12/13/17   Marinda Elk, MD  lisinopril (PRINIVIL,ZESTRIL) 10 MG tablet Take 10 mg by mouth daily. 11/23/17   [provider]  naproxen (NAPROSYN) 500 MG tablet Take 1 tablet (500 mg total) by mouth 2 (two) times daily. 06/01/18   Hedges, Tinnie Gens, PA-C  predniSONE (DELTASONE) 10 MG tablet Takes 3 tablets for 1 days, then 2 tabs for 1 days, then 1 tab for 1 days, and then stop. 12/13/17   Marinda Elk, MD  TOUJEO SOLOSTAR 300 UNIT/ML SOPN Inject 40 Units into the skin at bedtime.  03/15/17   [provider]    Family History No family history on file.  Social History Social History   Tobacco Use  . Smoking status: Never Smoker  . Smokeless tobacco: Never Used  Substance Use Topics  . Alcohol use: Yes    Comment: occasional   . Drug use: No     Allergies   Tape   Review of Systems Review of Systems  Respiratory:  Positive for cough.   All other systems reviewed and are negative.    Physical Exam Updated Vital Signs BP 130/75   Pulse 89   Temp 97.9 F (36.6 C) (Oral)   Resp 16   SpO2 100%   Physical Exam Vitals signs and nursing note reviewed.  Constitutional:      General: He is not in acute distress.    Appearance: He is well-developed. He is not diaphoretic.  HENT:     Head: Normocephalic and atraumatic.     Mouth/Throat:     Mouth: Mucous membranes are dry.  Neck:     Musculoskeletal: Normal range of motion and neck supple.  Cardiovascular:     Rate and Rhythm: Normal rate and regular rhythm.     Heart sounds: No murmur. No  friction rub.  Pulmonary:     Effort: Pulmonary effort is normal. No respiratory distress.     Breath sounds: Normal breath sounds. No wheezing or rales.  Abdominal:     General: Bowel sounds are normal. There is no distension.     Palpations: Abdomen is soft.     Tenderness: There is no abdominal tenderness.  Musculoskeletal: Normal range of motion.  Skin:    General: Skin is warm and dry.  Neurological:     Mental Status: He is alert and oriented to person, place, and time.     Coordination: Coordination normal.      ED Treatments / Results  Labs (all labs ordered are listed, but only abnormal results are displayed) Labs Reviewed  CBG MONITORING, ED - Abnormal; Notable for the following components:      Result Value   Glucose-Capillary >600 (*)    All other components within normal limits  BASIC METABOLIC PANEL  CBC WITH DIFFERENTIAL/PLATELET  LACTIC ACID, PLASMA  LACTIC ACID, PLASMA    EKG None  Radiology No results found.  Procedures Procedures (including critical care time)  Medications Ordered in ED Medications  sodium chloride 0.9 % bolus 1,000 mL (has no administration in time range)     Initial Impression / Assessment and Plan / ED Course  I have reviewed the triage vital signs and the nursing notes.  Pertinent labs & imaging results that were available during my care of the patient were reviewed by me and considered in my medical decision making (see chart for details).  Patient presents here with elevated blood sugar.  He is uncertain as to whether his insulin pump is malfunctioning.  His blood sugar at home read high.  Here today it was 601 with no evidence for DKA.  He was given intravenous fluids and IV insulin and his sugar is now improving.  At this point, I see no indication for admission.  I will have the patient continue his insulin pump as before and follow-up with his primary doctor in the next few days.  He is to keep a record of his blood  sugars.  Final Clinical Impressions(s) / ED Diagnoses   Final diagnoses:  None    ED Discharge Orders    None       Geoffery Lyons, MD 12/14/18 1510

## 2019-08-16 ENCOUNTER — Encounter (HOSPITAL_COMMUNITY): Payer: Self-pay | Admitting: Emergency Medicine

## 2019-08-16 ENCOUNTER — Emergency Department (HOSPITAL_COMMUNITY)
Admission: EM | Admit: 2019-08-16 | Discharge: 2019-08-17 | Disposition: A | Discharge status: Home / Self Care | Payer: Medicaid Other | Attending: Emergency Medicine | Admitting: Emergency Medicine

## 2019-08-16 ENCOUNTER — Other Ambulatory Visit: Payer: Self-pay

## 2019-08-16 DIAGNOSIS — E1065 Type 1 diabetes mellitus with hyperglycemia: Secondary | ICD-10-CM | POA: Diagnosis not present

## 2019-08-16 DIAGNOSIS — E162 Hypoglycemia, unspecified: Secondary | ICD-10-CM

## 2019-08-16 DIAGNOSIS — I10 Essential (primary) hypertension: Secondary | ICD-10-CM | POA: Diagnosis not present

## 2019-08-16 DIAGNOSIS — Z79899 Other long term (current) drug therapy: Secondary | ICD-10-CM | POA: Diagnosis not present

## 2019-08-16 DIAGNOSIS — Z794 Long term (current) use of insulin: Secondary | ICD-10-CM | POA: Insufficient documentation

## 2019-08-16 NOTE — ED Triage Notes (Signed)
Tonight the patient was pulled over by police for going the wrong way on the road. He was found to be hypoglycemic with a CBG of 33. D10 was given via an IV which brought his CBG to 121. Patient prefers to be called "Fish".  EMS vitals: 130 CBG 162/90 BP 98 HR 20 Reap Rate 99% O2 sat on room air

## 2019-08-17 ENCOUNTER — Encounter (HOSPITAL_COMMUNITY): Payer: Self-pay | Admitting: Emergency Medicine

## 2019-08-17 LAB — I-STAT CHEM 8, ED
BUN: 34 mg/dL — ABNORMAL HIGH (ref 6–20)
Calcium, Ion: 1.21 mmol/L (ref 1.15–1.40)
Chloride: 103 mmol/L (ref 98–111)
Creatinine, Ser: 1.6 mg/dL — ABNORMAL HIGH (ref 0.61–1.24)
Glucose, Bld: 154 mg/dL — ABNORMAL HIGH (ref 70–99)
HCT: 42 % (ref 39.0–52.0)
Hemoglobin: 14.3 g/dL (ref 13.0–17.0)
Potassium: 4.6 mmol/L (ref 3.5–5.1)
Sodium: 137 mmol/L (ref 135–145)
TCO2: 25 mmol/L (ref 22–32)

## 2019-08-17 LAB — CBC WITH DIFFERENTIAL/PLATELET
Abs Immature Granulocytes: 0.04 10*3/uL (ref 0.00–0.07)
Basophils Absolute: 0.1 10*3/uL (ref 0.0–0.1)
Basophils Relative: 1 %
Eosinophils Absolute: 0.2 10*3/uL (ref 0.0–0.5)
Eosinophils Relative: 2 %
HCT: 42.3 % (ref 39.0–52.0)
Hemoglobin: 14.1 g/dL (ref 13.0–17.0)
Immature Granulocytes: 0 %
Lymphocytes Relative: 7 %
Lymphs Abs: 0.7 10*3/uL (ref 0.7–4.0)
MCH: 30.3 pg (ref 26.0–34.0)
MCHC: 33.3 g/dL (ref 30.0–36.0)
MCV: 91 fL (ref 80.0–100.0)
Monocytes Absolute: 0.5 10*3/uL (ref 0.1–1.0)
Monocytes Relative: 5 %
Neutro Abs: 9 10*3/uL — ABNORMAL HIGH (ref 1.7–7.7)
Neutrophils Relative %: 85 %
Platelets: 296 10*3/uL (ref 150–400)
RBC: 4.65 MIL/uL (ref 4.22–5.81)
RDW: 11.7 % (ref 11.5–15.5)
WBC: 10.5 10*3/uL (ref 4.0–10.5)
nRBC: 0 % (ref 0.0–0.2)

## 2019-08-17 LAB — CBG MONITORING, ED
Glucose-Capillary: 148 mg/dL — ABNORMAL HIGH (ref 70–99)
Glucose-Capillary: 181 mg/dL — ABNORMAL HIGH (ref 70–99)

## 2019-08-17 NOTE — ED Provider Notes (Signed)
[facility]-EMERGENCY DEPT Provider Note   CSN: [id number] Arrival date & time: 08/16/19  2316     History   Chief Complaint No chief complaint on file.   HPI Antonio Martin is a 28 y.o. male.     The history is provided by the patient.  Hypoglycemia Initial blood sugar:  33 Blood sugar after intervention:  121 Severity:  Moderate Onset quality:  Sudden Timing:  Constant Chronicity:  New Diabetic status:  Controlled with insulin Current diabetic therapy:  SQ insulin Time since last antidiabetic medication:  5 hours Context: decreased oral intake   Relieved by:  Nothing Ineffective treatments:  None tried Associated symptoms: altered mental status   Associated symptoms: no dizziness and no shortness of breath   Risk factors: no alcohol abuse and no cancer   Patient pulled over for going the wrong way.  Found to have a glucose of 33.  Given D10 in the field.  Is now awake and alert and states he took his evening insulin and did not eat more than a chicken leg.    Past Medical History:  Diagnosis Date  . Diabetes mellitus without complication (HCC)   . Hypertension    new last 2 wks    Patient Active Problem List   Diagnosis Date Noted  . Severe sepsis (HCC) 12/11/2017  . Acute respiratory failure with hypoxia (HCC) 12/10/2017  . Community acquired pneumonia 12/10/2017  . Acute kidney injury (HCC) 12/10/2017  . Essential hypertension 12/10/2017  . Hyperglycemia 07/30/2015  . Type 1 diabetes mellitus (HCC) 07/30/2015    Past Surgical History:  Procedure Laterality Date  . EYE SURGERY    . FINGER SURGERY          Home Medications    Prior to Admission medications   Medication Sig Start Date End Date Taking? Authorizing Provider  GLUCAGON EMERGENCY 1 MG injection Inject 1 mg into the skin as directed.  10/04/18  Yes [provider]  Insulin Human (INSULIN PUMP) SOLN Inject 1 each into the skin continuous. Uses Novolog  70  units total daily   Yes [provider]  lisinopril (PRINIVIL,ZESTRIL) 10 MG tablet Take 10 mg by mouth daily. 11/23/17  Yes [provider]  NOVOLOG 100 UNIT/ML injection 70 Units daily. Use in insulin pump 10/03/18  Yes [provider]  albuterol (PROVENTIL HFA;VENTOLIN HFA) 108 (90 Base) MCG/ACT inhaler Inhale 1-2 puffs into the lungs every 6 (six) hours as needed for wheezing or shortness of breath. Patient not taking: Reported on 08/16/2019 12/07/17   Lorre NickAllen, Anthony, MD  azithromycin (ZITHROMAX) 250 MG tablet Take 1 tablet (250 mg total) by mouth daily. Take first 2 tablets together, then 1 every day until finished. Patient not taking: Reported on 08/16/2019 12/14/18   Geoffery Lyonselo, Douglas, MD  benzonatate (TESSALON) 100 MG capsule Take 1 capsule (100 mg total) by mouth every 8 (eight) hours. Patient not taking: Reported on 08/16/2019 12/07/17   Lorre NickAllen, Anthony, MD  levofloxacin (LEVAQUIN) 500 MG tablet Take 1 tablet (500 mg total) by mouth at bedtime. Patient not taking: Reported on 08/16/2019 12/13/17   Marinda ElkFeliz Ortiz, Abraham, MD  naproxen (NAPROSYN) 500 MG tablet Take 1 tablet (500 mg total) by mouth 2 (two) times daily. Patient not taking: Reported on 12/14/2018 06/01/18   Hedges, Tinnie GensJeffrey, PA-C  predniSONE (DELTASONE) 10 MG tablet Takes 3 tablets for 1 days, then 2 tabs for 1 days, then 1 tab for 1 days, and then stop. Patient not taking:  Reported on 12/14/2018 12/13/17   Marinda Elk, MD    Family History History reviewed. No pertinent family history.  Social History Social History   Tobacco Use  . Smoking status: Never Smoker  . Smokeless tobacco: Never Used  Substance Use Topics  . Alcohol use: Yes    Comment: occasional   . Drug use: No     Allergies   Tape   Review of Systems Review of Systems  Constitutional: Negative for fever.  HENT: Negative for congestion.   Respiratory: Negative for shortness of breath.   Cardiovascular: Negative for chest  pain.  Gastrointestinal: Negative for abdominal distention.  Genitourinary: Negative for difficulty urinating.  Musculoskeletal: Negative for arthralgias.  Neurological: Negative for dizziness.  Psychiatric/Behavioral: Negative for agitation.  All other systems reviewed and are negative.    Physical Exam Updated Vital Signs BP (!) 170/93 (BP Location: Left Leg)   Pulse 93   Temp (!) 97.4 F (36.3 C) (Oral)   Resp 15   Ht 5\' 3"  (1.6 m)   Wt 59.9 kg   SpO2 99%   BMI 23.38 kg/m   Physical Exam Vitals signs and nursing note reviewed.  Constitutional:      General: He is not in acute distress.    Appearance: He is normal weight.  HENT:     Head: Normocephalic and atraumatic.     Nose: Nose normal.  Eyes:     Conjunctiva/sclera: Conjunctivae normal.     Pupils: Pupils are equal, round, and reactive to light.  Neck:     Musculoskeletal: Normal range of motion and neck supple.  Cardiovascular:     Rate and Rhythm: Normal rate and regular rhythm.     Pulses: Normal pulses.     Heart sounds: Normal heart sounds.  Pulmonary:     Effort: Pulmonary effort is normal.     Breath sounds: Normal breath sounds.  Abdominal:     General: Abdomen is flat.     Tenderness: There is no abdominal tenderness. There is no guarding.  Musculoskeletal: Normal range of motion.  Skin:    General: Skin is warm and dry.     Capillary Refill: Capillary refill takes less than 2 seconds.  Neurological:     Mental Status: He is alert and oriented to person, place, and time.  Psychiatric:        Thought Content: Thought content normal.      ED Treatments / Results  Labs (all labs ordered are listed, but only abnormal results are displayed) Results for orders placed or performed during the hospital encounter of 08/16/19  CBC with Differential/Platelet  Result Value Ref Range   WBC 10.5 4.0 - 10.5 K/uL   RBC 4.65 4.22 - 5.81 MIL/uL   Hemoglobin 14.1 13.0 - 17.0 g/dL   HCT 08/18/19 38.1 - 01.7 %    MCV 91.0 80.0 - 100.0 fL   MCH 30.3 26.0 - 34.0 pg   MCHC 33.3 30.0 - 36.0 g/dL   RDW 51.0 25.8 - 52.7 %   Platelets 296 150 - 400 K/uL   nRBC 0.0 0.0 - 0.2 %   Neutrophils Relative % 85 %   Neutro Abs 9.0 (H) 1.7 - 7.7 K/uL   Lymphocytes Relative 7 %   Lymphs Abs 0.7 0.7 - 4.0 K/uL   Monocytes Relative 5 %   Monocytes Absolute 0.5 0.1 - 1.0 K/uL   Eosinophils Relative 2 %   Eosinophils Absolute 0.2 0.0 - 0.5 K/uL  Basophils Relative 1 %   Basophils Absolute 0.1 0.0 - 0.1 K/uL   Immature Granulocytes 0 %   Abs Immature Granulocytes 0.04 0.00 - 0.07 K/uL  I-stat chem 8, ED (not at [facility] or [facility])  Result Value Ref Range   Sodium 137 135 - 145 mmol/L   Potassium 4.6 3.5 - 5.1 mmol/L   Chloride 103 98 - 111 mmol/L   BUN 34 (H) 6 - 20 mg/dL   Creatinine, Ser 1.60 (H) 0.61 - 1.24 mg/dL   Glucose, Bld 154 (H) 70 - 99 mg/dL   Calcium, Ion 1.21 1.15 - 1.40 mmol/L   TCO2 25 22 - 32 mmol/L   Hemoglobin 14.3 13.0 - 17.0 g/dL   HCT 42.0 39.0 - 52.0 %  POC CBG, ED  Result Value Ref Range   Glucose-Capillary 181 (H) 70 - 99 mg/dL   No results found.  Radiology No results found.  Procedures Procedures (including critical care time)  Medications Ordered in ED Medications - No data to display   PO challenged successfully in the ED.  Reportedly became rude and belligerent with ER staff while waiting for results and wanted to leave.    Antonio Martin was evaluated in Emergency Department on 08/17/2019 for the symptoms described in the history of present illness. He was evaluated in the context of the global COVID-19 pandemic, which necessitated consideration that the patient might be at risk for infection with the SARS-CoV-2 virus that causes COVID-19. Institutional protocols and algorithms that pertain to the evaluation of patients at risk for COVID-19 are in a state of rapid change based on information released by regulatory bodies including the CDC and federal and state  organizations. These policies and algorithms were followed during the patient's care in the ED.   Final Clinical Impressions(s) / ED Diagnoses   Return for weakness, numbness, changes in vision or speech, fevers >100.4 unrelieved by medication, shortness of breath, intractable vomiting, or diarrhea, abdominal pain, Inability to tolerate liquids or food, cough, altered mental status or any concerns. No signs of systemic illness or infection. The patient is nontoxic-appearing on exam and vital signs are within normal limits.   I have reviewed the triage vital signs and the nursing notes. Pertinent labs &imaging results that were available during my care of the patient were reviewed by me and considered in my medical decision making (see chart for details).  After history, exam, and medical workup I feel the patient has been appropriately medically screened and is safe for discharge home. Pertinent diagnoses were discussed with the patient. Patient was given return precautions      Minetta Krisher, MD 08/17/19 2536

## 2019-08-17 NOTE — ED Notes (Signed)
Pt started to get aggressive about wanting to be discharged, I explained to him that he didn't have discharge papers but I would see if I could get him some, he started to become more aggressive, which time he was asked to go back to his room and wait on paperwork. Pt thanked everyone when he left

## 2020-04-26 ENCOUNTER — Other Ambulatory Visit: Payer: Self-pay

## 2020-04-26 ENCOUNTER — Encounter (HOSPITAL_COMMUNITY): Payer: Self-pay

## 2020-04-26 ENCOUNTER — Emergency Department (HOSPITAL_COMMUNITY)
Admission: EM | Admit: 2020-04-26 | Discharge: 2020-04-27 | Disposition: A | Discharge status: Home / Self Care | Payer: Medicaid Other | Attending: Emergency Medicine | Admitting: Emergency Medicine

## 2020-04-26 DIAGNOSIS — I10 Essential (primary) hypertension: Secondary | ICD-10-CM | POA: Diagnosis not present

## 2020-04-26 DIAGNOSIS — Z794 Long term (current) use of insulin: Secondary | ICD-10-CM | POA: Insufficient documentation

## 2020-04-26 DIAGNOSIS — E119 Type 2 diabetes mellitus without complications: Secondary | ICD-10-CM | POA: Diagnosis not present

## 2020-04-26 DIAGNOSIS — R112 Nausea with vomiting, unspecified: Secondary | ICD-10-CM | POA: Diagnosis not present

## 2020-04-26 DIAGNOSIS — R1110 Vomiting, unspecified: Secondary | ICD-10-CM | POA: Diagnosis present

## 2020-04-26 DIAGNOSIS — Z79899 Other long term (current) drug therapy: Secondary | ICD-10-CM | POA: Insufficient documentation

## 2020-04-26 LAB — URINALYSIS, ROUTINE W REFLEX MICROSCOPIC
Bacteria, UA: NONE SEEN
Bilirubin Urine: NEGATIVE
Glucose, UA: 50 mg/dL — AB
Hgb urine dipstick: NEGATIVE
Ketones, ur: 5 mg/dL — AB
Leukocytes,Ua: NEGATIVE
Nitrite: NEGATIVE
Protein, ur: 100 mg/dL — AB
Specific Gravity, Urine: 1.011 (ref 1.005–1.030)
pH: 6 (ref 5.0–8.0)

## 2020-04-26 LAB — COMPREHENSIVE METABOLIC PANEL
ALT: 15 U/L (ref 0–44)
AST: 18 U/L (ref 15–41)
Albumin: 4.2 g/dL (ref 3.5–5.0)
Alkaline Phosphatase: 82 U/L (ref 38–126)
Anion gap: 7 (ref 5–15)
BUN: 19 mg/dL (ref 6–20)
CO2: 26 mmol/L (ref 22–32)
Calcium: 9 mg/dL (ref 8.9–10.3)
Chloride: 104 mmol/L (ref 98–111)
Creatinine, Ser: 1.42 mg/dL — ABNORMAL HIGH (ref 0.61–1.24)
GFR calc Af Amer: >60 mL/min (ref >60)
GFR calc non Af Amer: >60 mL/min (ref >60)
Glucose, Bld: 143 mg/dL — ABNORMAL HIGH (ref 70–99)
Potassium: 4.4 mmol/L (ref 3.5–5.1)
Sodium: 137 mmol/L (ref 135–145)
Total Bilirubin: 0.7 mg/dL (ref 0.3–1.2)
Total Protein: 7.1 g/dL (ref 6.5–8.1)

## 2020-04-26 LAB — CBC
HCT: 37.5 % — ABNORMAL LOW (ref 39.0–52.0)
Hemoglobin: 12.6 g/dL — ABNORMAL LOW (ref 13.0–17.0)
MCH: 30.3 pg (ref 26.0–34.0)
MCHC: 33.6 g/dL (ref 30.0–36.0)
MCV: 90.1 fL (ref 80.0–100.0)
Platelets: 241 10*3/uL (ref 150–400)
RBC: 4.16 MIL/uL — ABNORMAL LOW (ref 4.22–5.81)
RDW: 11.4 % — ABNORMAL LOW (ref 11.5–15.5)
WBC: 6.2 10*3/uL (ref 4.0–10.5)
nRBC: 0 % (ref 0.0–0.2)

## 2020-04-26 LAB — CBG MONITORING, ED
Glucose-Capillary: 115 mg/dL — ABNORMAL HIGH (ref 70–99)
Glucose-Capillary: 157 mg/dL — ABNORMAL HIGH (ref 70–99)

## 2020-04-26 LAB — LIPASE, BLOOD: Lipase: 25 U/L (ref 11–51)

## 2020-04-26 MED ORDER — ALUM & MAG HYDROXIDE-SIMETH 200-200-20 MG/5ML PO SUSP
30.0000 mL | Freq: Once | ORAL | Status: AC
  Administered 2020-04-27: 30 mL via ORAL
  Filled 2020-04-26: qty 30

## 2020-04-26 MED ORDER — ACETAMINOPHEN 500 MG PO TABS
1000.0000 mg | ORAL_TABLET | Freq: Once | ORAL | Status: AC
  Administered 2020-04-27: 1000 mg via ORAL
  Filled 2020-04-26: qty 2

## 2020-04-26 MED ORDER — ONDANSETRON 8 MG PO TBDP
8.0000 mg | ORAL_TABLET | Freq: Once | ORAL | Status: AC
  Administered 2020-04-27: 8 mg via ORAL
  Filled 2020-04-26: qty 1

## 2020-04-26 NOTE — ED Triage Notes (Signed)
Pt sts n/v since second [employer] covid vaccine yesterday.

## 2020-04-27 ENCOUNTER — Encounter (HOSPITAL_COMMUNITY): Payer: Self-pay | Admitting: Emergency Medicine

## 2020-04-27 NOTE — ED Provider Notes (Signed)
[facility]-EMERGENCY DEPT Provider Note   CSN: [id number] Arrival date & time: 04/26/20  2123     History Chief Complaint  Patient presents with  . Emesis    Antonio Martin is a 29 y.o. male.  The history is provided by the patient.  Emesis Severity:  Mild Duration:  1 day Timing:  Rare Number of daily episodes:  2 Quality:  Stomach contents Able to tolerate:  Liquids Progression:  Unchanged Chronicity:  New Recent urination:  Normal Context: not post-tussive   Relieved by:  Nothing Worsened by:  Nothing Ineffective treatments:  None tried Associated symptoms: no abdominal pain, no arthralgias, no cough, no fever and no URI   Associated symptoms comment:  Received pfizer vaccine yesterday and symptoms started after that.   Risk factors: no alcohol use        Past Medical History:  Diagnosis Date  . Diabetes mellitus without complication (HCC)   . Hypertension    new last 2 wks    Patient Active Problem List   Diagnosis Date Noted  . Severe sepsis (HCC) 12/11/2017  . Acute respiratory failure with hypoxia (HCC) 12/10/2017  . Community acquired pneumonia 12/10/2017  . Acute kidney injury (HCC) 12/10/2017  . Essential hypertension 12/10/2017  . Hyperglycemia 07/30/2015  . Type 1 diabetes mellitus (HCC) 07/30/2015    Past Surgical History:  Procedure Laterality Date  . EYE SURGERY    . FINGER SURGERY         History reviewed. No pertinent family history.  Social History   Tobacco Use  . Smoking status: Never Smoker  . Smokeless tobacco: Never Used  Vaping Use  . Vaping Use: Never used  Substance Use Topics  . Alcohol use: Yes    Comment: occasional   . Drug use: No    Home Medications Prior to Admission medications   Medication Sig Start Date End Date Taking? Authorizing Provider  albuterol (PROVENTIL HFA;VENTOLIN HFA) 108 (90 Base) MCG/ACT inhaler Inhale 1-2 puffs into the lungs every 6 (six) hours as needed for  wheezing or shortness of breath. Patient not taking: Reported on 08/16/2019 12/07/17   Lorre Nick, MD  azithromycin (ZITHROMAX) 250 MG tablet Take 1 tablet (250 mg total) by mouth daily. Take first 2 tablets together, then 1 every day until finished. Patient not taking: Reported on 08/16/2019 12/14/18   Geoffery Lyons, MD  benzonatate (TESSALON) 100 MG capsule Take 1 capsule (100 mg total) by mouth every 8 (eight) hours. Patient not taking: Reported on 08/16/2019 12/07/17   Lorre Nick, MD  GLUCAGON EMERGENCY 1 MG injection Inject 1 mg into the skin as directed.  10/04/18   [provider]  Insulin Human (INSULIN PUMP) SOLN Inject 1 each into the skin continuous. Uses Novolog  70 units total daily    [provider]  levofloxacin (LEVAQUIN) 500 MG tablet Take 1 tablet (500 mg total) by mouth at bedtime. Patient not taking: Reported on 08/16/2019 12/13/17   Marinda Elk, MD  lisinopril (PRINIVIL,ZESTRIL) 10 MG tablet Take 10 mg by mouth daily. 11/23/17   [provider]  naproxen (NAPROSYN) 500 MG tablet Take 1 tablet (500 mg total) by mouth 2 (two) times daily. Patient not taking: Reported on 12/14/2018 06/01/18   Hedges, Tinnie Gens, PA-C  NOVOLOG 100 UNIT/ML injection 70 Units daily. Use in insulin pump 10/03/18   [provider]  predniSONE (DELTASONE) 10 MG tablet Takes 3 tablets for 1 days, then 2 tabs for 1 days,  then 1 tab for 1 days, and then stop. Patient not taking: Reported on 12/14/2018 12/13/17   Marinda Elk, MD    Allergies    Tape  Review of Systems   Review of Systems  Constitutional: Negative for fatigue and fever.  HENT: Negative for congestion.   Eyes: Negative for photophobia and visual disturbance.  Respiratory: Negative for cough and shortness of breath.   Gastrointestinal: Positive for nausea and vomiting. Negative for abdominal pain.  Genitourinary: Negative for difficulty urinating.  Musculoskeletal: Negative for  arthralgias.  Skin: Negative for rash.  Neurological: Negative for dizziness.  Psychiatric/Behavioral: Negative for agitation.  All other systems reviewed and are negative.   Physical Exam Updated Vital Signs BP 140/74   Pulse 96   Temp 99.4 F (37.4 C) (Oral)   Resp 18   Ht 5\' 3"  (1.6 m)   Wt 60.3 kg   SpO2 100%   BMI 23.56 kg/m   Physical Exam Vitals and nursing note reviewed.  Constitutional:      General: He is not in acute distress.    Appearance: Normal appearance.  HENT:     Head: Normocephalic and atraumatic.     Nose: Nose normal.     Mouth/Throat:     Mouth: Mucous membranes are moist.  Eyes:     Conjunctiva/sclera: Conjunctivae normal.     Pupils: Pupils are equal, round, and reactive to light.  Cardiovascular:     Rate and Rhythm: Normal rate and regular rhythm.     Pulses: Normal pulses.     Heart sounds: Normal heart sounds.  Pulmonary:     Effort: Pulmonary effort is normal.     Breath sounds: Normal breath sounds.  Abdominal:     General: Abdomen is flat. Bowel sounds are normal.     Tenderness: There is no abdominal tenderness. There is no guarding or rebound.  Musculoskeletal:        General: Normal range of motion.     Cervical back: Normal range of motion and neck supple.  Skin:    General: Skin is warm and dry.     Capillary Refill: Capillary refill takes less than 2 seconds.  Neurological:     General: No focal deficit present.     Mental Status: He is alert and oriented to person, place, and time.     Deep Tendon Reflexes: Reflexes normal.  Psychiatric:        Mood and Affect: Mood normal.        Behavior: Behavior normal.     ED Results / Procedures / Treatments   Labs (all labs ordered are listed, but only abnormal results are displayed) Results for orders placed or performed during the hospital encounter of 04/26/20  Lipase, blood  Result Value Ref Range   Lipase 25 11 - 51 U/L  Comprehensive metabolic panel  Result Value  Ref Range   Sodium 137 135 - 145 mmol/L   Potassium 4.4 3.5 - 5.1 mmol/L   Chloride 104 98 - 111 mmol/L   CO2 26 22 - 32 mmol/L   Glucose, Bld 143 (H) 70 - 99 mg/dL   BUN 19 6 - 20 mg/dL   Creatinine, Ser 06/27/20 (H) 0.61 - 1.24 mg/dL   Calcium 9.0 8.9 - 0.09 mg/dL   Total Protein 7.1 6.5 - 8.1 g/dL   Albumin 4.2 3.5 - 5.0 g/dL   AST 18 15 - 41 U/L   ALT 15 0 - 44 U/L  Alkaline Phosphatase 82 38 - 126 U/L   Total Bilirubin 0.7 0.3 - 1.2 mg/dL   GFR calc non Af Amer >60 >60 mL/min   GFR calc Af Amer >60 >60 mL/min   Anion gap 7 5 - 15  CBC  Result Value Ref Range   WBC 6.2 4.0 - 10.5 K/uL   RBC 4.16 (L) 4.22 - 5.81 MIL/uL   Hemoglobin 12.6 (L) 13.0 - 17.0 g/dL   HCT 16.0 (L) 39 - 52 %   MCV 90.1 80.0 - 100.0 fL   MCH 30.3 26.0 - 34.0 pg   MCHC 33.6 30.0 - 36.0 g/dL   RDW 73.7 (L) 10.6 - 26.9 %   Platelets 241 150 - 400 K/uL   nRBC 0.0 0.0 - 0.2 %  Urinalysis, Routine w reflex microscopic  Result Value Ref Range   Color, Urine YELLOW YELLOW   APPearance CLEAR CLEAR   Specific Gravity, Urine 1.011 1.005 - 1.030   pH 6.0 5.0 - 8.0   Glucose, UA 50 (A) NEGATIVE mg/dL   Hgb urine dipstick NEGATIVE NEGATIVE   Bilirubin Urine NEGATIVE NEGATIVE   Ketones, ur 5 (A) NEGATIVE mg/dL   Protein, ur 485 (A) NEGATIVE mg/dL   Nitrite NEGATIVE NEGATIVE   Leukocytes,Ua NEGATIVE NEGATIVE   RBC / HPF 0-5 0 - 5 RBC/hpf   WBC, UA 0-5 0 - 5 WBC/hpf   Bacteria, UA NONE SEEN NONE SEEN  CBG monitoring, ED  Result Value Ref Range   Glucose-Capillary 115 (H) 70 - 99 mg/dL  CBG monitoring, ED  Result Value Ref Range   Glucose-Capillary 157 (H) 70 - 99 mg/dL   No results found.  EKG None  Radiology No results found.  Procedures Procedures (including critical care time)  Medications Ordered in ED Medications  ondansetron (ZOFRAN-ODT) disintegrating tablet 8 mg (8 mg Oral Given 04/27/20 0036)  acetaminophen (TYLENOL) tablet 1,000 mg (1,000 mg Oral Given 04/27/20 0033)  alum & mag  hydroxide-simeth (MAALOX/MYLANTA) 200-200-20 MG/5ML suspension 30 mL (30 mLs Oral Given 04/27/20 0034)    ED Course  I have reviewed the triage vital signs and the nursing notes.  Pertinent labs & imaging results that were available during my care of the patient were reviewed by me and considered in my medical decision making (see chart for details).   PO challenged successfully in the ED. Exam, labs and vitals are benign and reassuring.  Known side effect of the vaccinations or could be related to an underlying viral process.  Well appearing. Stable for discharge with close follow up.      Final Clinical Impression(s) / ED Diagnoses.    Return for intractable cough, coughing up blood,fevers >100.4 unrelieved by medication, shortness of breath, intractable vomiting, chest pain, shortness of breath, weakness,numbness, changes in speech, facial asymmetry,abdominal pain, passing out,Inability to tolerate liquids or food, cough, altered mental status or any concerns. No signs of systemic illness or infection. The patient is nontoxic-appearing on exam and vital signs are within normal limits.   I have reviewed the triage vital signs and the nursing notes. Pertinent labs &imaging results that were available during my care of the patient were reviewed by me and considered in my medical decision making (see chart for details).After history, exam, and medical workup I feel the patient has beenappropriately medically screened and is safe for discharge home. Pertinent diagnoses were discussed with the patient. Patient was given return precautions.   Alphonsa Brickle, MD 04/27/20 0120

## 2020-06-02 ENCOUNTER — Emergency Department (HOSPITAL_COMMUNITY): Payer: Medicaid Other

## 2020-06-02 ENCOUNTER — Encounter (HOSPITAL_COMMUNITY): Payer: Self-pay | Admitting: Emergency Medicine

## 2020-06-02 ENCOUNTER — Emergency Department (HOSPITAL_COMMUNITY)
Admission: EM | Admit: 2020-06-02 | Discharge: 2020-06-02 | Disposition: A | Discharge status: Home / Self Care | Payer: Medicaid Other | Attending: Emergency Medicine | Admitting: Emergency Medicine

## 2020-06-02 DIAGNOSIS — Y998 Other external cause status: Secondary | ICD-10-CM | POA: Diagnosis not present

## 2020-06-02 DIAGNOSIS — I10 Essential (primary) hypertension: Secondary | ICD-10-CM | POA: Diagnosis not present

## 2020-06-02 DIAGNOSIS — S6992XA Unspecified injury of left wrist, hand and finger(s), initial encounter: Secondary | ICD-10-CM | POA: Diagnosis not present

## 2020-06-02 DIAGNOSIS — Z79899 Other long term (current) drug therapy: Secondary | ICD-10-CM | POA: Insufficient documentation

## 2020-06-02 DIAGNOSIS — E1065 Type 1 diabetes mellitus with hyperglycemia: Secondary | ICD-10-CM | POA: Insufficient documentation

## 2020-06-02 DIAGNOSIS — Y929 Unspecified place or not applicable: Secondary | ICD-10-CM | POA: Diagnosis not present

## 2020-06-02 DIAGNOSIS — Y9366 Activity, soccer: Secondary | ICD-10-CM | POA: Diagnosis not present

## 2020-06-02 DIAGNOSIS — W010XXA Fall on same level from slipping, tripping and stumbling without subsequent striking against object, initial encounter: Secondary | ICD-10-CM | POA: Diagnosis not present

## 2020-06-02 MED ORDER — NAPROXEN 500 MG PO TABS
500.0000 mg | ORAL_TABLET | Freq: Two times a day (BID) | ORAL | 0 refills | Status: AC | PRN
Start: 2020-06-02 — End: ?

## 2020-06-02 NOTE — Progress Notes (Signed)
Orthopedic Tech Progress Note Patient Details:  Antonio Martin April 06, 1991 [id number]  Ortho Devices Type of Ortho Device: Velcro wrist forearm splint Ortho Device/Splint Location: LUE Ortho Device/Splint Interventions: Ordered, Application   Post Interventions Patient Tolerated: Well Instructions Provided: Care of device   Donald Pore 06/02/2020, 10:05 AM

## 2020-06-02 NOTE — Discharge Instructions (Addendum)
Please read and follow all provided instructions.  You have been seen today for a left wrist injury.   Tests performed today include: An x-ray of the affected area - does NOT show any broken bones or dislocations.  Vital signs. See below for your results today.   Home care instructions: -- *PRICE in the first 24-48 hours: Protect (with brace, splint, sling), if given by your provider Rest Ice- Do not apply ice pack directly to your skin, place towel or similar between your skin and ice/ice pack. Apply ice for 20 min, then remove for 40 min while awake Compression- Wear brace, elastic bandage, splint as directed by your provider Elevate affected extremity above the level of your heart when not walking around for the first 24-48 hours   Medications: - Naproxen is a nonsteroidal anti-inflammatory medication that will help with pain and swelling. Be sure to take this medication as prescribed with food, 1 pill every 12 hours,  It should be taken with food, as it can cause stomach upset, and more seriously, stomach bleeding. Do not take other nonsteroidal anti-inflammatory medications with this such as Advil, Motrin, Aleve, Mobic, Goodie Powder, or Motrin.    You make take Tylenol per over the counter dosing with these medications.   We have prescribed you new medication(s) today. Discuss the medications prescribed today with your pharmacist as they can have adverse effects and interactions with your other medicines including over the counter and prescribed medications. Seek medical evaluation if you start to experience new or abnormal symptoms after taking one of these medicines, seek care immediately if you start to experience difficulty breathing, feeling of your throat closing, facial swelling, or rash as these could be indications of a more serious allergic reaction   Follow-up instructions: Please follow-up with your primary care provider or the provided orthopedic physician (bone specialist)  if you continue to have significant pain in 1 week. In this case you may have a more severe injury that requires further care.   Return instructions:  Please return if your digits or extremity are numb or tingling, appear gray or blue, or you have severe pain (also elevate the extremity and loosen splint or wrap if you were given one) Please return if you have redness or fevers.  Please return to the Emergency Department if you experience worsening symptoms.  Please return if you have any other emergent concerns. Additional Information:  Your vital signs today were: BP 134/80 (BP Location: Right Arm)   Pulse 92   Temp 98.5 F (36.9 C) (Oral)   Resp 16   Ht 5\' 4"  (1.626 m)   Wt 61.7 kg   SpO2 100%   BMI 23.34 kg/m  If your blood pressure (BP) was elevated above 135/85 this visit, please have this repeated by your doctor within one month. ---------------

## 2020-06-02 NOTE — ED Triage Notes (Signed)
Pt. Stated, I hurt my wrist playing soccer on my left wrist.

## 2020-06-02 NOTE — ED Provider Notes (Signed)
[facility] EMERGENCY DEPARTMENT Provider Note   CSN: [id number] Arrival date & time: 06/02/20  2297     History Chief Complaint  Patient presents with  . Wrist Pain    Antonio Martin is a 29 y.o. male with a history of hypertension & T1DM who presents to the ED with left wrist injury that occurred 1 week prior. Patient states he was playing soccer, tripped, and fell onto the wrist. Denies head injury or LOC. Has been having pain especially with movement & picking things up since. He is L hand dominant. Denies numbness, tingling, weakness, open wounds, or other areas of injury.   Offered translator, patient declined, able to provide history without difficulty.   HPI     Past Medical History:  Diagnosis Date  . Diabetes mellitus without complication (HCC)   . Hypertension    new last 2 wks    Patient Active Problem List   Diagnosis Date Noted  . Severe sepsis (HCC) 12/11/2017  . Acute respiratory failure with hypoxia (HCC) 12/10/2017  . Community acquired pneumonia 12/10/2017  . Acute kidney injury (HCC) 12/10/2017  . Essential hypertension 12/10/2017  . Hyperglycemia 07/30/2015  . Type 1 diabetes mellitus (HCC) 07/30/2015    Past Surgical History:  Procedure Laterality Date  . EYE SURGERY    . FINGER SURGERY         No family history on file.  Social History   Tobacco Use  . Smoking status: Never Smoker  . Smokeless tobacco: Never Used  Vaping Use  . Vaping Use: Never used  Substance Use Topics  . Alcohol use: Yes    Comment: occasional   . Drug use: No    Home Medications Prior to Admission medications   Medication Sig Start Date End Date Taking? Authorizing Provider  albuterol (PROVENTIL HFA;VENTOLIN HFA) 108 (90 Base) MCG/ACT inhaler Inhale 1-2 puffs into the lungs every 6 (six) hours as needed for wheezing or shortness of breath. Patient not taking: Reported on 08/16/2019 12/07/17   Lorre Nick, MD  azithromycin (ZITHROMAX)  250 MG tablet Take 1 tablet (250 mg total) by mouth daily. Take first 2 tablets together, then 1 every day until finished. Patient not taking: Reported on 08/16/2019 12/14/18   Geoffery Lyons, MD  benzonatate (TESSALON) 100 MG capsule Take 1 capsule (100 mg total) by mouth every 8 (eight) hours. Patient not taking: Reported on 08/16/2019 12/07/17   Lorre Nick, MD  GLUCAGON EMERGENCY 1 MG injection Inject 1 mg into the skin as directed.  10/04/18   [provider]  Insulin Human (INSULIN PUMP) SOLN Inject 1 each into the skin continuous. Uses Novolog  70 units total daily    [provider]  levofloxacin (LEVAQUIN) 500 MG tablet Take 1 tablet (500 mg total) by mouth at bedtime. Patient not taking: Reported on 08/16/2019 12/13/17   Marinda Elk, MD  lisinopril (PRINIVIL,ZESTRIL) 10 MG tablet Take 10 mg by mouth daily. 11/23/17   [provider]  naproxen (NAPROSYN) 500 MG tablet Take 1 tablet (500 mg total) by mouth 2 (two) times daily. Patient not taking: Reported on 12/14/2018 06/01/18   Hedges, Tinnie Gens, PA-C  NOVOLOG 100 UNIT/ML injection 70 Units daily. Use in insulin pump 10/03/18   [provider]  predniSONE (DELTASONE) 10 MG tablet Takes 3 tablets for 1 days, then 2 tabs for 1 days, then 1 tab for 1 days, and then stop. Patient not taking: Reported on 12/14/2018 12/13/17   David Stall,  Darin Engels, MD    Allergies    Tape  Review of Systems   Review of Systems  Constitutional: Negative for chills and fever.  Respiratory: Negative for shortness of breath.   Cardiovascular: Negative for chest pain.  Gastrointestinal: Negative for abdominal pain.  Musculoskeletal: Positive for arthralgias. Negative for back pain and neck pain.  Skin: Negative for wound.  Neurological: Negative for syncope, weakness, numbness and headaches.    Physical Exam Updated Vital Signs BP 134/80 (BP Location: Right Arm)   Pulse 92   Temp 98.5 F (36.9 C) (Oral)   Resp 16    Ht 5\' 4"  (1.626 m)   Wt 61.7 kg   SpO2 100%   BMI 23.34 kg/m   Physical Exam Vitals and nursing note reviewed.  Constitutional:      General: He is not in acute distress.    Appearance: Normal appearance. He is not ill-appearing or toxic-appearing.  HENT:     Head: Normocephalic and atraumatic.  Neck:     Comments: No midline tenderness.  Cardiovascular:     Rate and Rhythm: Normal rate.     Pulses:          Radial pulses are 2+ on the right side and 2+ on the left side.  Pulmonary:     Effort: No respiratory distress.     Breath sounds: Normal breath sounds.  Musculoskeletal:     Cervical back: Normal range of motion and neck supple.     Comments: Upper extremities: No obvious deformity, erythema, ecchymosis, warmth, or open wounds. Patient has intact AROM throughout. Tender to palpation to the dorsal left wrist as well as to the 4th/5th metacarpal area. No other tenderness. No anatomical snuffbox tenderness. .   Skin:    General: Skin is warm and dry.     Capillary Refill: Capillary refill takes less than 2 seconds.  Neurological:     Mental Status: He is alert.     Comments: Alert. Clear speech. Sensation grossly intact to bilateral upper extremities. 5/5 symmetric grip strength. Ambulatory. Able to perform OK sign, thumbs up, and cross 2nd/3rd digits bilaterally.   Psychiatric:        Mood and Affect: Mood normal.        Behavior: Behavior normal.     ED Results / Procedures / Treatments   Labs (all labs ordered are listed, but only abnormal results are displayed) Labs Reviewed - No data to display  EKG None  Radiology DG Wrist Complete Left  Result Date: 06/02/2020 CLINICAL DATA:  Recent fall while playing soccer with wrist pain, initial encounter EXAM: LEFT WRIST - COMPLETE 3+ VIEW COMPARISON:  None. FINDINGS: No acute fracture or dislocation is noted. No soft tissue abnormality is noted. Diffuse vascular calcifications are seen consistent with the given  clinical history of diabetes. IMPRESSION: No acute abnormality noted. Electronically Signed   By: 08/02/2020 M.D.   On: 06/02/2020 08:50   DG Hand Complete Left  Result Date: 06/02/2020 CLINICAL DATA:  Recent fall while playing soccer with wrist pain, initial encounter EXAM: LEFT HAND - COMPLETE 3+ VIEW COMPARISON:  None. FINDINGS: There is no evidence of fracture or dislocation. There is no evidence of arthropathy or other focal bone abnormality. Soft tissues are unremarkable. IMPRESSION: No acute abnormality noted. Electronically Signed   By: 08/02/2020 M.D.   On: 06/02/2020 08:51    Procedures Procedures (including critical care time)  Medications Ordered in ED Medications - No data to display  ED Course  I have reviewed the triage vital signs and the nursing notes.  Pertinent labs & imaging results that were available during my care of the patient were reviewed by me and considered in my medical decision making (see chart for details).    MDM Rules/Calculators/A&P                         Patient presents to the ED with complaints of L wrist pain S/p injury about 1 week ago.  Nontoxic, resting comfortably, vitals WNL.  No signs of infection on exam to raise concern for septic joint.  I ordered x-rays of the L wrist/hand which I have personally reviewed & interpreted- in agreement with radiologist impression, no fracture/dislocation noted.  NVI distally. Wrist brace applied. Will provide naproxen to take as needed for pain. PRICE. I discussed results, treatment plan, need for follow-up, and return precautions with the patient. Provided opportunity for questions, patient confirmed understanding and is in agreement with plan.   Final Clinical Impression(s) / ED Diagnoses Final diagnoses:  Injury of left wrist, initial encounter    Rx / DC Orders ED Discharge Orders         Ordered    naproxen (NAPROSYN) 500 MG tablet  2 times daily PRN     Discontinue  Reprint     06/02/20 0906            Anjel Perfetti, Pleas Koch, PA-C 06/02/20 3220    Geoffery Lyons, MD 06/02/20 1243

## 2020-06-10 ENCOUNTER — Telehealth: Payer: Self-pay | Admitting: Family Medicine

## 2020-06-10 NOTE — Telephone Encounter (Signed)
Called pt to offer follow-up care for wrist injury--left message for him to call office if interested in scheduling appt .   -glh

## 2020-11-20 IMAGING — CR DG WRIST COMPLETE 3+V*L*
4 series · 4 of 4 positions shown · non-contrast
Comparison: None.

CLINICAL DATA: Recent fall while playing soccer with wrist pain,
initial encounter

EXAM:
LEFT WRIST - COMPLETE 3+ VIEW

[wrist pa]
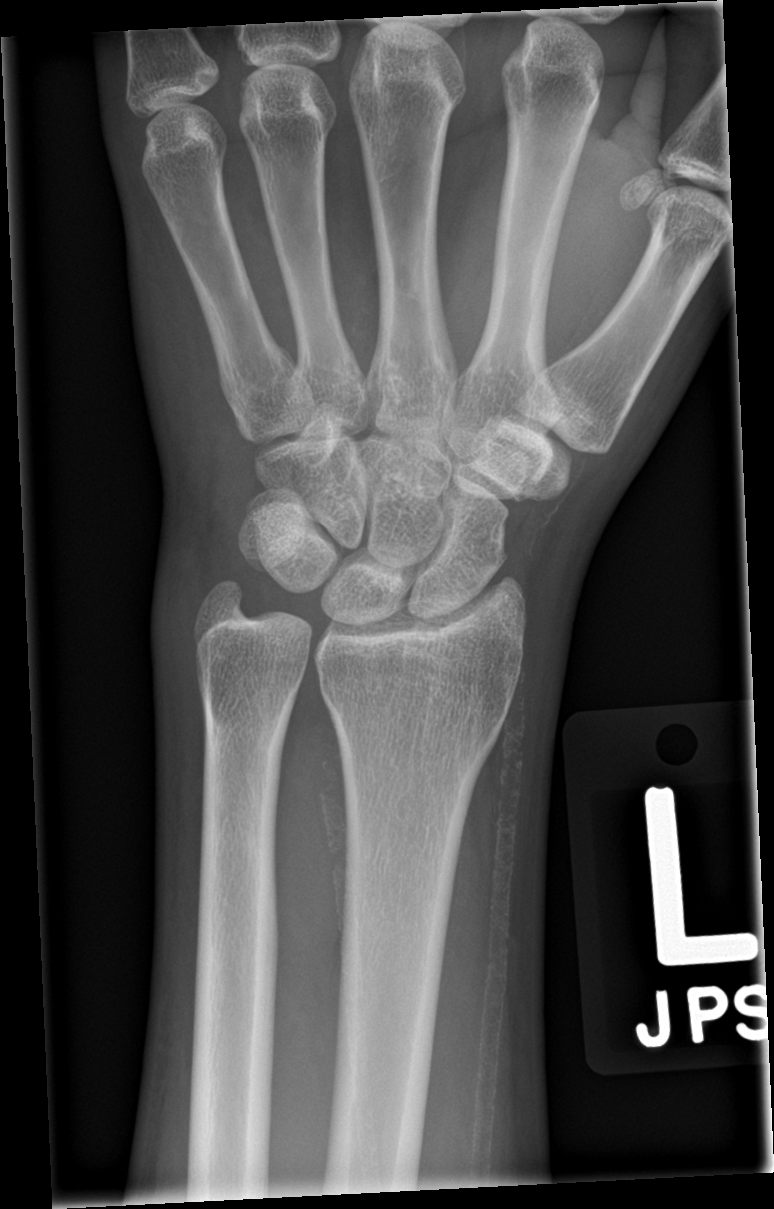

[wrist obl]
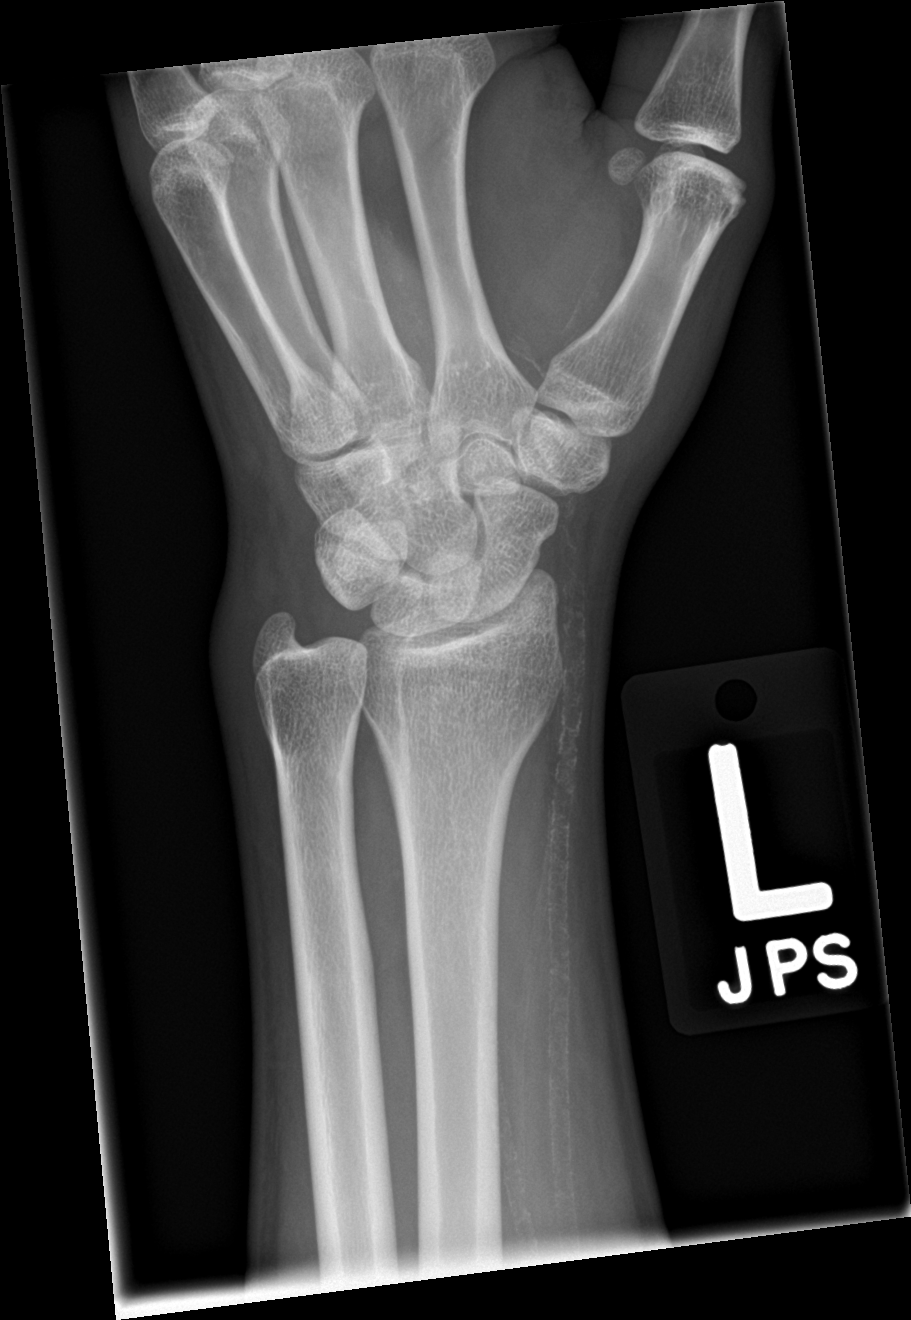

[wrist lat]
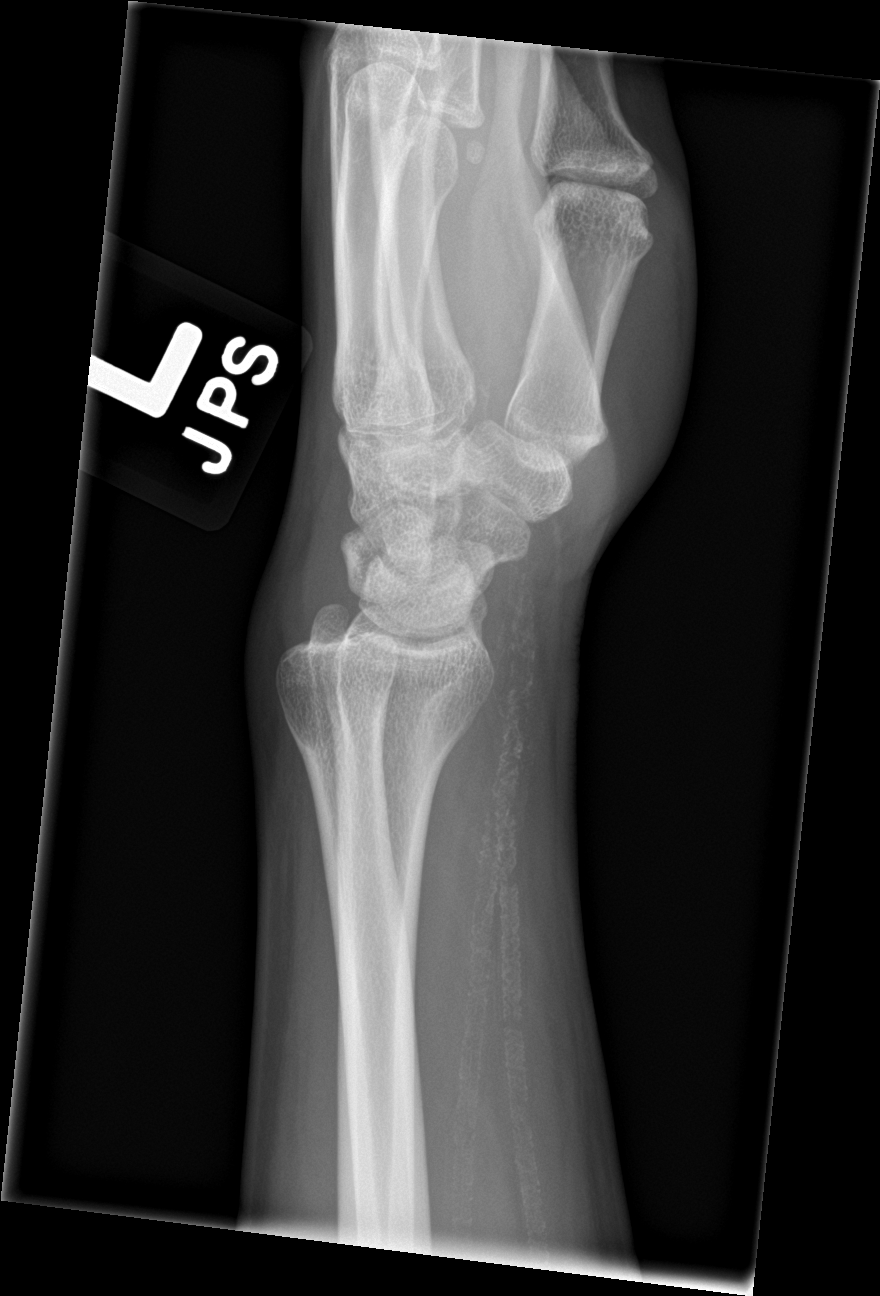

[wrist navicular]
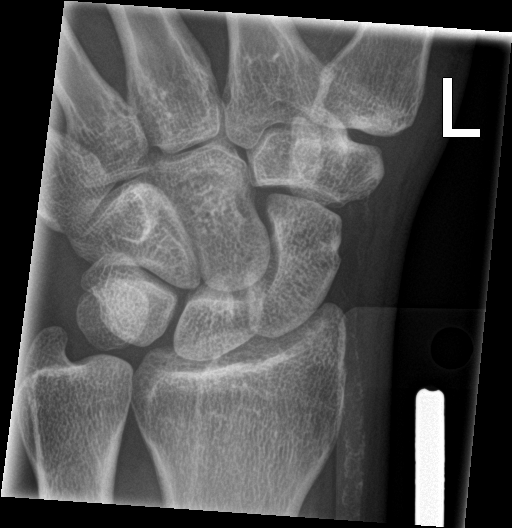

[4 of 4 positions shown; findings below may reference images not displayed]

FINDINGS: No acute fracture or dislocation is noted. No soft tissue
abnormality is noted. Diffuse vascular calcifications are seen
consistent with the given clinical history of diabetes.
IMPRESSION: No acute abnormality noted.

## 2020-11-20 IMAGING — CR DG HAND COMPLETE 3+V*L*
3 series · 3 of 3 positions shown · non-contrast
Comparison: None.

CLINICAL DATA: Recent fall while playing soccer with wrist pain,
initial encounter

EXAM:
LEFT HAND - COMPLETE 3+ VIEW

[hand pa]
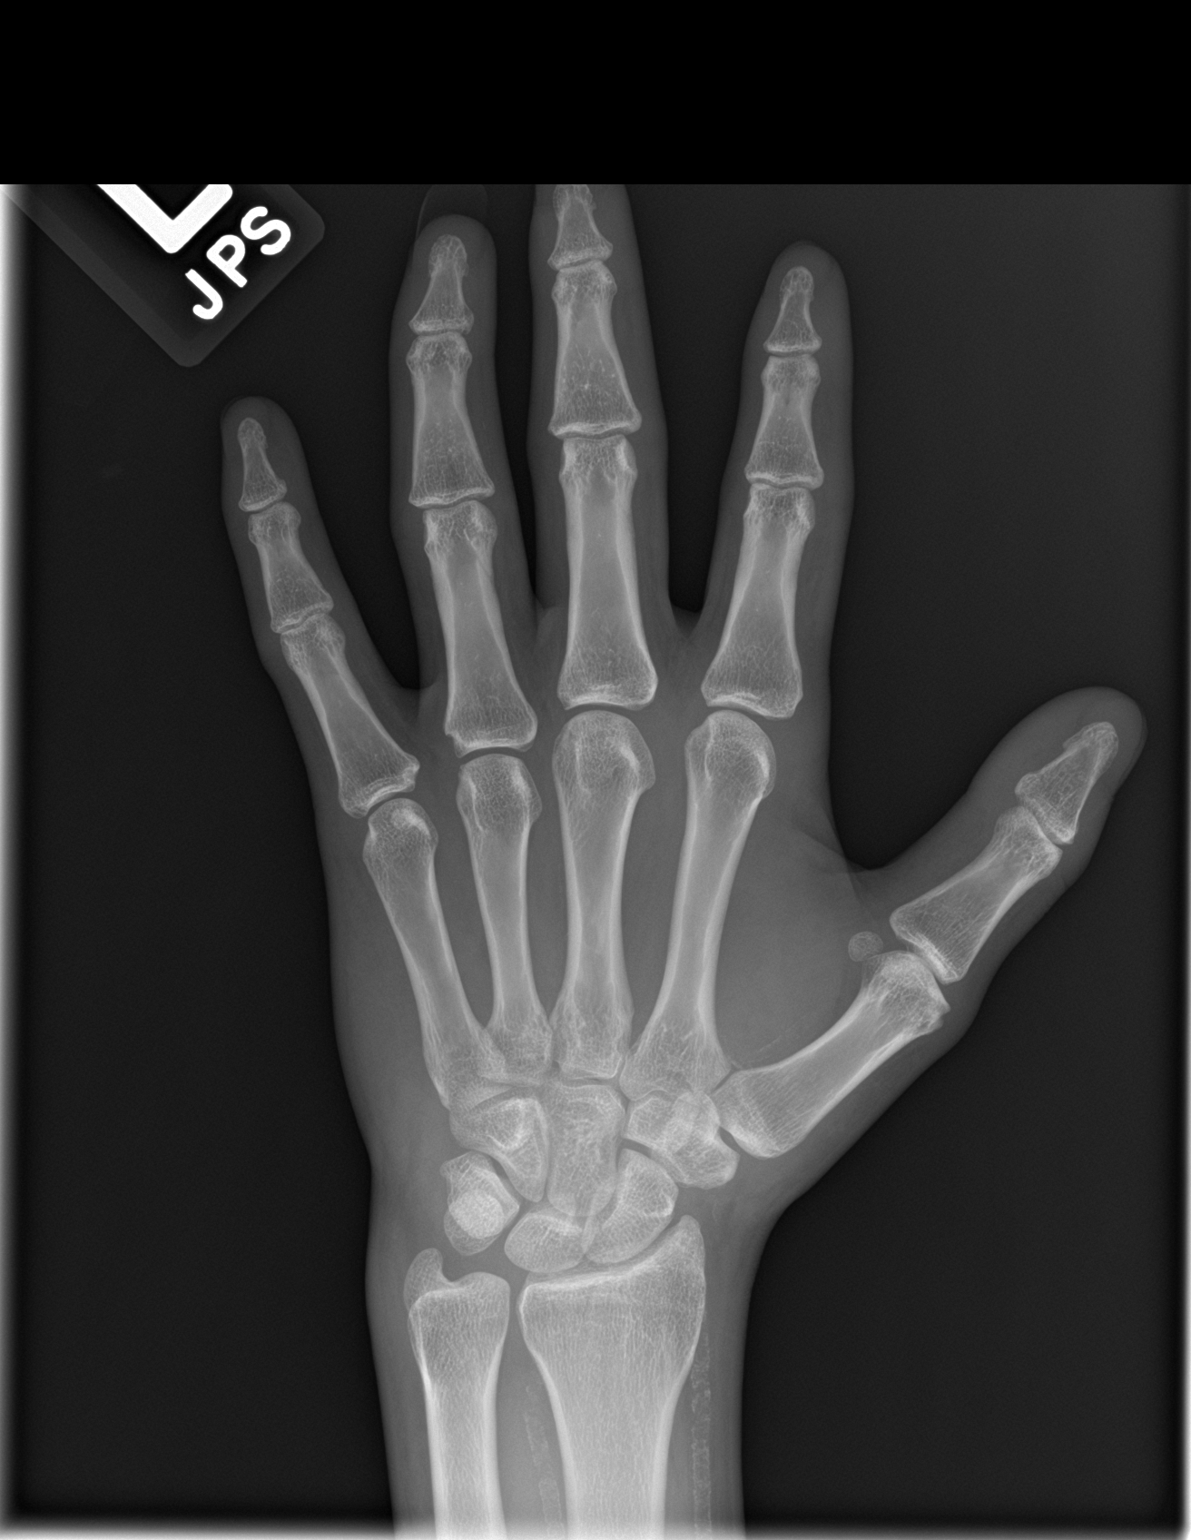

[hand obl]
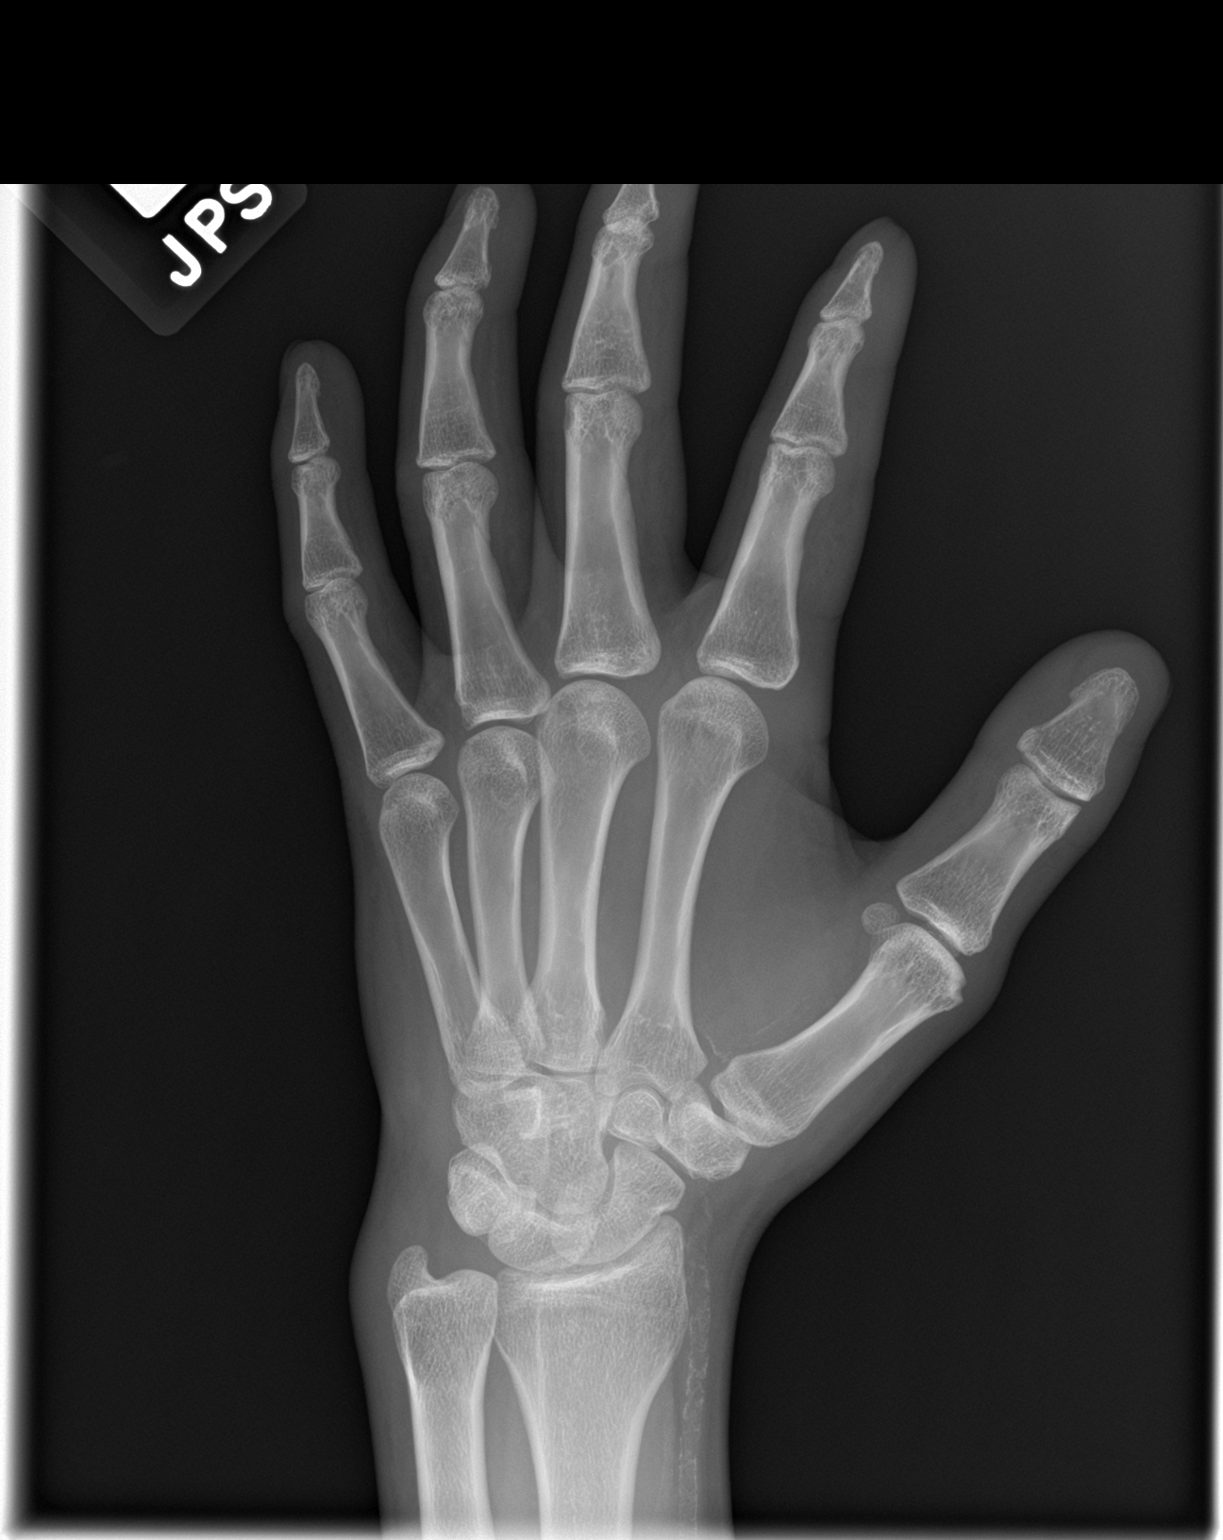

[hand lat]
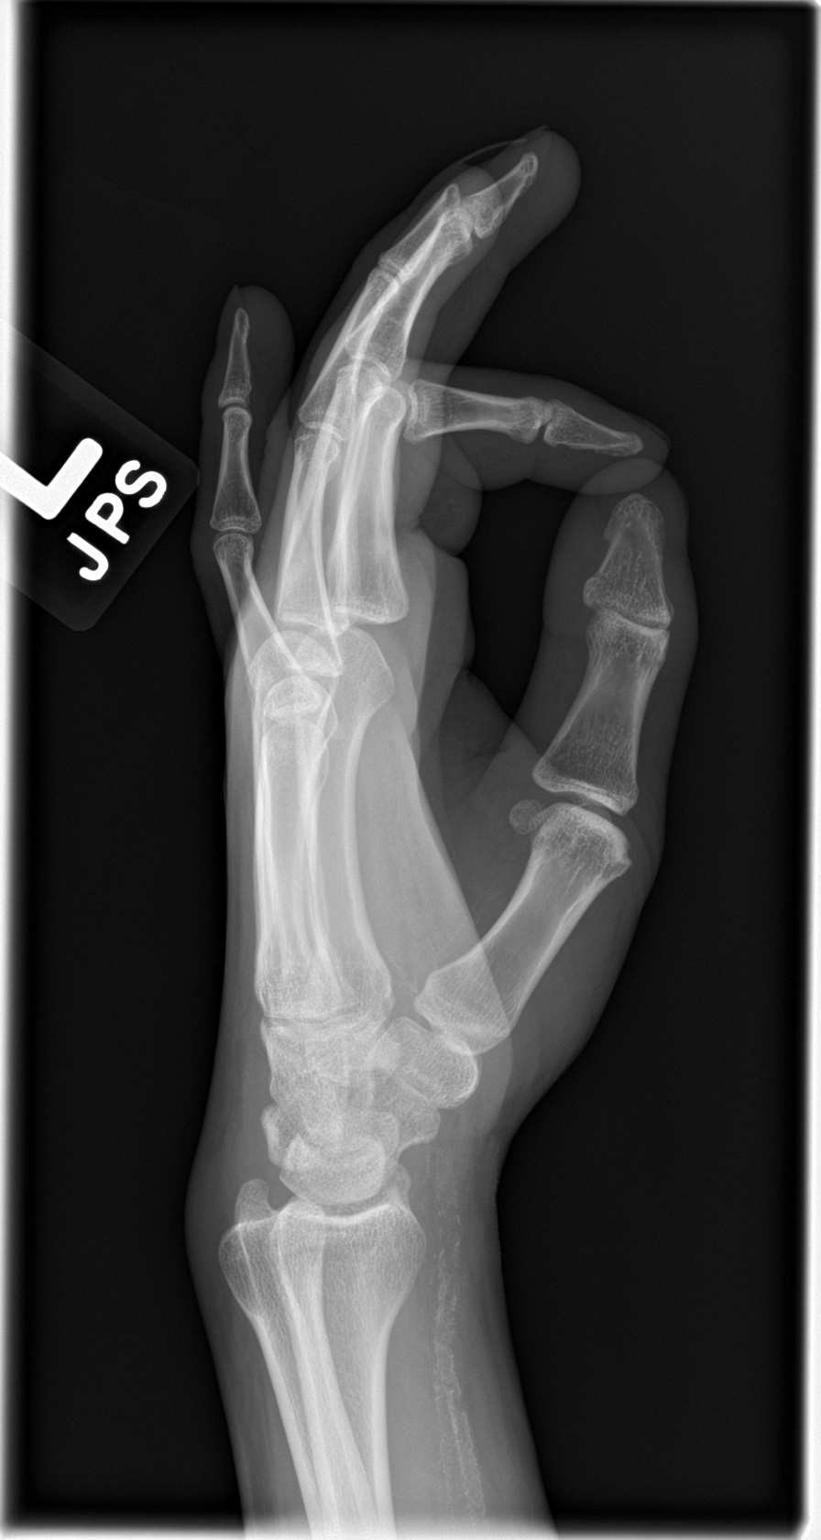

[3 of 3 positions shown; findings below may reference images not displayed]

FINDINGS: There is no evidence of fracture or dislocation. There is no
evidence of arthropathy or other focal bone abnormality. Soft
tissues are unremarkable.
IMPRESSION: No acute abnormality noted.

## 2020-12-11 ENCOUNTER — Other Ambulatory Visit: Payer: Self-pay | Admitting: Internal Medicine

## 2020-12-12 LAB — COMPLETE METABOLIC PANEL WITH GFR
AG Ratio: 1.6 (calc) (ref 1.0–2.5)
ALT: 13 U/L (ref 9–46)
AST: 16 U/L (ref 10–40)
Albumin: 4.1 g/dL (ref 3.6–5.1)
Alkaline phosphatase (APISO): 95 U/L (ref 36–130)
BUN/Creatinine Ratio: 16 (calc) (ref 6–22)
BUN: 26 mg/dL — ABNORMAL HIGH (ref 7–25)
CO2: 24 mmol/L (ref 20–32)
Calcium: 9.4 mg/dL (ref 8.6–10.3)
Chloride: 107 mmol/L (ref 98–110)
Creat: 1.61 mg/dL — ABNORMAL HIGH (ref 0.60–1.35)
GFR, Est African American: 66 mL/min/{1.73_m2} (ref >=60)
GFR, Est Non African American: 57 mL/min/{1.73_m2} — ABNORMAL LOW (ref >=60)
Globulin: 2.5 g/dL (calc) (ref 1.9–3.7)
Glucose, Bld: 73 mg/dL (ref 65–99)
Potassium: 4.4 mmol/L (ref 3.5–5.3)
Sodium: 142 mmol/L (ref 135–146)
Total Bilirubin: 0.4 mg/dL (ref 0.2–1.2)
Total Protein: 6.6 g/dL (ref 6.1–8.1)

## 2020-12-12 LAB — CBC
HCT: 39.4 % (ref 38.5–50.0)
Hemoglobin: 13.5 g/dL (ref 13.2–17.1)
MCH: 30.9 pg (ref 27.0–33.0)
MCHC: 34.3 g/dL (ref 32.0–36.0)
MCV: 90.2 fL (ref 80.0–100.0)
MPV: 10.1 fL (ref 7.5–12.5)
Platelets: 299 10*3/uL (ref 140–400)
RBC: 4.37 10*6/uL (ref 4.20–5.80)
RDW: 11.8 % (ref 11.0–15.0)
WBC: 6.7 10*3/uL (ref 3.8–10.8)

## 2020-12-12 LAB — LIPID PANEL
Cholesterol: 137 mg/dL (ref <200)
HDL: 60 mg/dL (ref >=40)
LDL Cholesterol (Calc): 61 mg/dL (calc)
Non-HDL Cholesterol (Calc): 77 mg/dL (calc) (ref <130)
Total CHOL/HDL Ratio: 2.3 (calc) (ref <5.0)
Triglycerides: 78 mg/dL (ref <150)

## 2020-12-12 LAB — HEMOGLOBIN A1C W/OUT EAG: Hgb A1c MFr Bld: 8.3 % of total Hgb — ABNORMAL HIGH (ref <5.7)

## 2020-12-12 LAB — VITAMIN D 25 HYDROXY (VIT D DEFICIENCY, FRACTURES): Vit D, 25-Hydroxy: 43 ng/mL (ref 30–100)

## 2020-12-12 LAB — TSH: TSH: 4.23 mIU/L (ref 0.40–4.50)

## 2021-05-21 ENCOUNTER — Emergency Department (HOSPITAL_COMMUNITY): Payer: Medicaid Other

## 2021-05-21 ENCOUNTER — Other Ambulatory Visit: Payer: Self-pay

## 2021-05-21 ENCOUNTER — Emergency Department (HOSPITAL_COMMUNITY)
Admission: EM | Admit: 2021-05-21 | Discharge: 2021-05-21 | Disposition: A | Discharge status: Home / Self Care | Payer: Medicaid Other | Attending: Emergency Medicine | Admitting: Emergency Medicine

## 2021-05-21 ENCOUNTER — Emergency Department (HOSPITAL_BASED_OUTPATIENT_CLINIC_OR_DEPARTMENT_OTHER): Payer: Medicaid Other

## 2021-05-21 DIAGNOSIS — Z79899 Other long term (current) drug therapy: Secondary | ICD-10-CM | POA: Diagnosis not present

## 2021-05-21 DIAGNOSIS — M779 Enthesopathy, unspecified: Secondary | ICD-10-CM | POA: Diagnosis not present

## 2021-05-21 DIAGNOSIS — M7989 Other specified soft tissue disorders: Secondary | ICD-10-CM | POA: Diagnosis not present

## 2021-05-21 DIAGNOSIS — E1065 Type 1 diabetes mellitus with hyperglycemia: Secondary | ICD-10-CM | POA: Diagnosis not present

## 2021-05-21 DIAGNOSIS — N179 Acute kidney failure, unspecified: Secondary | ICD-10-CM | POA: Diagnosis not present

## 2021-05-21 DIAGNOSIS — I10 Essential (primary) hypertension: Secondary | ICD-10-CM | POA: Diagnosis not present

## 2021-05-21 DIAGNOSIS — R739 Hyperglycemia, unspecified: Secondary | ICD-10-CM

## 2021-05-21 DIAGNOSIS — M79631 Pain in right forearm: Secondary | ICD-10-CM | POA: Insufficient documentation

## 2021-05-21 LAB — BASIC METABOLIC PANEL
Anion gap: 8 (ref 5–15)
BUN: 33 mg/dL — ABNORMAL HIGH (ref 6–20)
CO2: 21 mmol/L — ABNORMAL LOW (ref 22–32)
Calcium: 8.9 mg/dL (ref 8.9–10.3)
Chloride: 103 mmol/L (ref 98–111)
Creatinine, Ser: 2.19 mg/dL — ABNORMAL HIGH (ref 0.61–1.24)
GFR, Estimated: 41 mL/min — ABNORMAL LOW (ref >60)
Glucose, Bld: 301 mg/dL — ABNORMAL HIGH (ref 70–99)
Potassium: 4.3 mmol/L (ref 3.5–5.1)
Sodium: 132 mmol/L — ABNORMAL LOW (ref 135–145)

## 2021-05-21 LAB — CBC WITH DIFFERENTIAL/PLATELET
Abs Immature Granulocytes: 0.02 10*3/uL (ref 0.00–0.07)
Basophils Absolute: 0.1 10*3/uL (ref 0.0–0.1)
Basophils Relative: 1 %
Eosinophils Absolute: 1.1 10*3/uL — ABNORMAL HIGH (ref 0.0–0.5)
Eosinophils Relative: 13 %
HCT: 34.1 % — ABNORMAL LOW (ref 39.0–52.0)
Hemoglobin: 11.6 g/dL — ABNORMAL LOW (ref 13.0–17.0)
Immature Granulocytes: 0 %
Lymphocytes Relative: 21 %
Lymphs Abs: 1.8 10*3/uL (ref 0.7–4.0)
MCH: 30.3 pg (ref 26.0–34.0)
MCHC: 34 g/dL (ref 30.0–36.0)
MCV: 89 fL (ref 80.0–100.0)
Monocytes Absolute: 0.7 10*3/uL (ref 0.1–1.0)
Monocytes Relative: 8 %
Neutro Abs: 4.9 10*3/uL (ref 1.7–7.7)
Neutrophils Relative %: 57 %
Platelets: 263 10*3/uL (ref 150–400)
RBC: 3.83 MIL/uL — ABNORMAL LOW (ref 4.22–5.81)
RDW: 11.6 % (ref 11.5–15.5)
WBC: 8.5 10*3/uL (ref 4.0–10.5)
nRBC: 0 % (ref 0.0–0.2)

## 2021-05-21 LAB — CBG MONITORING, ED: Glucose-Capillary: 273 mg/dL — ABNORMAL HIGH (ref 70–99)

## 2021-05-21 MED ORDER — SODIUM CHLORIDE 0.9 % IV BOLUS
1000.0000 mL | Freq: Once | INTRAVENOUS | Status: AC
  Administered 2021-05-21: 1000 mL via INTRAVENOUS

## 2021-05-21 MED ORDER — CYCLOBENZAPRINE HCL 10 MG PO TABS: 10.0000 mg | ORAL_TABLET | Freq: Two times a day (BID) | ORAL | 0 refills | Status: AC | PRN

## 2021-05-21 NOTE — ED Provider Notes (Signed)
MSE was initiated and I personally evaluated the patient and placed orders (if any) at  5:10 AM on May 21, 2021.  Patient with T1DM, HTN presents for evaluation of pain and swelling of the right dorsal forearm over the last 4 days. No fever. No known injury.   Today's Vitals   05/21/21 0452 05/21/21 0505  BP: (!) 155/86   Pulse: 79   Resp: (!) 22   Temp: 98.3 F (36.8 C)   TempSrc: Oral   SpO2: 100%   Weight:  65.3 kg  Height:  5\' 4"  (1.626 m)  PainSc:  9    Body mass index is 24.72 kg/m.   Right forearm swollen with mild induration midshaft dorsal FA. No redness or warmth. Flexion of the wrist elicits pain. Distal pulses present.   The patient appears stable so that the remainder of the MSE may be completed by another provider.   , PA-C 05/21/21 05/23/21    [id number], MD 05/21/21 [phone]

## 2021-05-21 NOTE — ED Triage Notes (Signed)
Pt c/o right lower arm pain and increased swelling that began Sunday. Tenderness above the wrist to mid forearm. Denies fever. Denies recent injury.

## 2021-05-21 NOTE — Progress Notes (Signed)
RUE venous duplex has been completed.  Dr. Dalene Seltzer given preliminary results.  Results can be found under chart review under CV PROC. 05/21/2021 11:03 AM Ammon Muscatello RVT, RDMS

## 2021-05-21 NOTE — ED Provider Notes (Signed)
[facility] EMERGENCY DEPARTMENT Provider Note   CSN: [id number] Arrival date & time: 05/21/21  0448     History Chief Complaint  Patient presents with   Right Arm Swelling    Antonio Martin is a 30 y.o. male.  HPI     30 year old male with history of diabetes, hypertension, presents with concern for right arm pain and swelling.  Reports that it began about 4 days ago while he was at the beach in [place].  Not sure of any specific injuries, or overuse.  Pain is worse with wrist extension.  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  Went straight from work at 2 AM to the hospital and has not taken diabetic medications.  It appears that he has seen [facility] for trigger finger.    Past Medical History:  Diagnosis Date   Diabetes mellitus without complication (HCC)    Hypertension    new last 2 wks    Patient Active Problem List   Diagnosis Date Noted   Severe sepsis (HCC) 12/11/2017   Acute respiratory failure with hypoxia (HCC) 12/10/2017   Community acquired pneumonia 12/10/2017   Acute kidney injury (HCC) 12/10/2017   Essential hypertension 12/10/2017   Hyperglycemia 07/30/2015   Type 1 diabetes mellitus (HCC) 07/30/2015    Past Surgical History:  Procedure Laterality Date   EYE SURGERY     FINGER SURGERY         No family history on file.  Social History   Tobacco Use   Smoking status: Never   Smokeless tobacco: Never  Vaping Use   Vaping Use: Never used  Substance Use Topics   Alcohol use: Yes    Comment: occasional    Drug use: No    Home Medications Prior to Admission medications   Medication Sig Start Date End Date Taking? Authorizing Provider  cyclobenzaprine (FLEXERIL) 10 MG tablet Take 1 tablet (10 mg total) by mouth 2 (two) times daily as needed for muscle spasms. 05/21/21  Yes Alvira Monday, MD  GLUCAGON EMERGENCY 1 MG injection  Inject 1 mg into the skin as directed.  10/04/18   [provider]  Insulin Human (INSULIN PUMP) SOLN Inject 1 each into the skin continuous. Uses Novolog  70 units total daily    [provider]  lisinopril (PRINIVIL,ZESTRIL) 10 MG tablet Take 10 mg by mouth daily. 11/23/17   [provider]  naproxen (NAPROSYN) 500 MG tablet Take 1 tablet (500 mg total) by mouth 2 (two) times daily as needed for moderate pain. 06/02/20   Petrucelli, Samantha R, PA-C  NOVOLOG 100 UNIT/ML injection 70 Units daily. Use in insulin pump 10/03/18   [provider]  predniSONE (DELTASONE) 10 MG tablet Takes 3 tablets for 1 days, then 2 tabs for 1 days, then 1 tab for 1 days, and then stop. Patient not taking: Reported on 12/14/2018 12/13/17   Marinda Elk, MD  albuterol (PROVENTIL HFA;VENTOLIN HFA) 108 [phone] Base) MCG/ACT inhaler Inhale 1-2 puffs into the lungs every 6 (six) hours as needed for wheezing or shortness of breath. Patient not taking: Reported on 08/16/2019 12/07/17 06/02/20  Lorre Nick, MD    Allergies    Tape  Review of Systems   Review of Systems  Constitutional:  Negative for fever.  Respiratory:  Negative for shortness of breath.   Cardiovascular:  Negative for chest pain.  Gastrointestinal:  Negative for abdominal pain,  nausea and vomiting.  Musculoskeletal:  Positive for arthralgias.  Skin:  Negative for rash.  Neurological:  Negative for syncope.   Physical Exam Updated Vital Signs BP (!) 147/95   Pulse 87   Temp 98.3 F (36.8 C) (Oral)   Resp 19   Ht 5\' 4"  (1.626 m)   Wt 65.3 kg   SpO2 98%   BMI 24.72 kg/m   Physical Exam Vitals and nursing note reviewed.  Constitutional:      General: He is not in acute distress.    Appearance: Normal appearance. He is not ill-appearing, toxic-appearing or diaphoretic.  HENT:     Head: Normocephalic.  Eyes:     Conjunctiva/sclera: Conjunctivae normal.  Cardiovascular:     Rate and Rhythm: Normal  rate and regular rhythm.     Pulses: Normal pulses.  Pulmonary:     Effort: Pulmonary effort is normal. No respiratory distress.  Musculoskeletal:        General: Swelling (right forearm extensor side) and tenderness (over radial extensor side along tendon) present. No deformity or signs of injury.     Cervical back: No rigidity.  Skin:    General: Skin is warm and dry.     Coloration: Skin is not jaundiced or pale.  Neurological:     General: No focal deficit present.     Mental Status: He is alert and oriented to person, place, and time.    ED Results / Procedures / Treatments   Labs (all labs ordered are listed, but only abnormal results are displayed) Labs Reviewed  CBC WITH DIFFERENTIAL/PLATELET - Abnormal; Notable for the following components:      Result Value   RBC 3.83 (*)    Hemoglobin 11.6 (*)    HCT 34.1 (*)    Eosinophils Absolute 1.1 (*)    All other components within normal limits  BASIC METABOLIC PANEL - Abnormal; Notable for the following components:   Sodium 132 (*)    CO2 21 (*)    Glucose, Bld 301 (*)    BUN 33 (*)    Creatinine, Ser 2.19 (*)    GFR, Estimated 41 (*)    All other components within normal limits  CBG MONITORING, ED - Abnormal; Notable for the following components:   Glucose-Capillary 273 (*)    All other components within normal limits    EKG None  Radiology DG Forearm Right  Result Date: 05/21/2021 CLINICAL DATA:  30 year old male with right arm pain and swelling for several days. No known injury. EXAM: RIGHT FOREARM - 2 VIEW COMPARISON:  None. FINDINGS: Calcified peripheral vascular disease. Bone mineralization is within normal limits. Maintained alignment at the right wrist and elbow. No evidence of elbow joint effusion. No osseous abnormality identified. Questionable dorsal soft tissue swelling at the forearm. No soft tissue gas. IMPRESSION: Negative aside from soft tissue swelling and calcified peripheral vascular disease.  Electronically Signed   By: 26 M.D.   On: 05/21/2021 05:51    Procedures Procedures   Medications Ordered in ED Medications  sodium chloride 0.9 % bolus 1,000 mL (0 mLs Intravenous Stopped 05/21/21 1021)    ED Course  I have reviewed the triage vital signs and the nursing notes.  Pertinent labs & imaging results that were available during my care of the patient were reviewed by me and considered in my medical decision making (see chart for details).    MDM Rules/Calculators/A&P  30 year old male with history of diabetes, hypertension, presents with concern for right arm pain and swelling.  Normal pulses bilaterally, no sign of acute arterial thrombus.  I do not detect any fluctuance, erythema, he is afebrile, and have low suspicion at this time for abscess, cellulitis, infectious myositis or osteomyelitis.    DVT study completed and negative for DVT.  Lab work completed in triage shows hyperglycemia as well as increased creatinine.  Given IV fluids, and discussed avoiding NSAIDs. Glucose improving. Recommend primary care physician follow-up.  Suspect likely tendinitis as etiology of pain, consider inflammatory myositis. Recommend follow up with [facility] as he has seen them before regarding forearm pain. Given rx for flexeril, recommend tylenol.     Final Clinical Impression(s) / ED Diagnoses Final diagnoses:  Tendonitis  Right forearm pain  Hyperglycemia  AKI (acute kidney injury) (HCC)    Rx / DC Orders ED Discharge Orders          Ordered    cyclobenzaprine (FLEXERIL) 10 MG tablet  2 times daily PRN        05/21/21 1041             Alvira Monday, MD 05/21/21 1042

## 2021-10-20 ENCOUNTER — Encounter (HOSPITAL_COMMUNITY): Payer: Self-pay | Admitting: Emergency Medicine

## 2021-10-20 ENCOUNTER — Ambulatory Visit (HOSPITAL_COMMUNITY)
Admission: EM | Admit: 2021-10-20 | Discharge: 2021-10-20 | Disposition: A | Discharge status: Home / Self Care | Payer: Medicaid Other | Attending: Internal Medicine | Admitting: Internal Medicine

## 2021-10-20 ENCOUNTER — Other Ambulatory Visit: Payer: Self-pay

## 2021-10-20 DIAGNOSIS — Z20822 Contact with and (suspected) exposure to covid-19: Secondary | ICD-10-CM | POA: Insufficient documentation

## 2021-10-20 NOTE — ED Triage Notes (Signed)
Pt reports needing covid test for flying no symptoms.

## 2021-10-21 LAB — SARS CORONAVIRUS 2 (TAT 6-24 HRS): SARS Coronavirus 2: NEGATIVE

## 2022-01-08 ENCOUNTER — Other Ambulatory Visit: Payer: Self-pay

## 2022-01-08 ENCOUNTER — Encounter (HOSPITAL_COMMUNITY): Payer: Self-pay

## 2022-01-08 ENCOUNTER — Emergency Department (HOSPITAL_COMMUNITY)
Admission: EM | Admit: 2022-01-08 | Discharge: 2022-01-08 | Disposition: A | Discharge status: Home / Self Care | Payer: Medicaid Other | Attending: Emergency Medicine | Admitting: Emergency Medicine

## 2022-01-08 ENCOUNTER — Emergency Department (HOSPITAL_COMMUNITY): Payer: Medicaid Other

## 2022-01-08 DIAGNOSIS — R110 Nausea: Secondary | ICD-10-CM

## 2022-01-08 DIAGNOSIS — Z20822 Contact with and (suspected) exposure to covid-19: Secondary | ICD-10-CM | POA: Diagnosis not present

## 2022-01-08 DIAGNOSIS — I10 Essential (primary) hypertension: Secondary | ICD-10-CM | POA: Diagnosis not present

## 2022-01-08 DIAGNOSIS — Z79899 Other long term (current) drug therapy: Secondary | ICD-10-CM | POA: Diagnosis not present

## 2022-01-08 DIAGNOSIS — E109 Type 1 diabetes mellitus without complications: Secondary | ICD-10-CM | POA: Insufficient documentation

## 2022-01-08 DIAGNOSIS — R112 Nausea with vomiting, unspecified: Secondary | ICD-10-CM | POA: Insufficient documentation

## 2022-01-08 DIAGNOSIS — R519 Headache, unspecified: Secondary | ICD-10-CM | POA: Diagnosis not present

## 2022-01-08 LAB — CBC WITH DIFFERENTIAL/PLATELET
Abs Immature Granulocytes: 0.01 10*3/uL (ref 0.00–0.07)
Basophils Absolute: 0.1 10*3/uL (ref 0.0–0.1)
Basophils Relative: 1 %
Eosinophils Absolute: 1 10*3/uL — ABNORMAL HIGH (ref 0.0–0.5)
Eosinophils Relative: 12 %
HCT: 49.1 % (ref 39.0–52.0)
Hemoglobin: 17.6 g/dL — ABNORMAL HIGH (ref 13.0–17.0)
Immature Granulocytes: 0 %
Lymphocytes Relative: 24 %
Lymphs Abs: 2 10*3/uL (ref 0.7–4.0)
MCH: 30.6 pg (ref 26.0–34.0)
MCHC: 35.8 g/dL (ref 30.0–36.0)
MCV: 85.4 fL (ref 80.0–100.0)
Monocytes Absolute: 0.7 10*3/uL (ref 0.1–1.0)
Monocytes Relative: 8 %
Neutro Abs: 4.5 10*3/uL (ref 1.7–7.7)
Neutrophils Relative %: 55 %
Platelets: 298 10*3/uL (ref 150–400)
RBC: 5.75 MIL/uL (ref 4.22–5.81)
RDW: 11.5 % (ref 11.5–15.5)
WBC: 8.3 10*3/uL (ref 4.0–10.5)
nRBC: 0 % (ref 0.0–0.2)

## 2022-01-08 LAB — RESP PANEL BY RT-PCR (FLU A&B, COVID) ARPGX2
Influenza A by PCR: NEGATIVE
Influenza B by PCR: NEGATIVE
SARS Coronavirus 2 by RT PCR: NEGATIVE

## 2022-01-08 LAB — RAPID URINE DRUG SCREEN, HOSP PERFORMED
Amphetamines: NOT DETECTED
Barbiturates: NOT DETECTED
Benzodiazepines: NOT DETECTED
Cocaine: NOT DETECTED
Opiates: NOT DETECTED
Tetrahydrocannabinol: NOT DETECTED

## 2022-01-08 LAB — I-STAT VENOUS BLOOD GAS, ED
Acid-base deficit: 2 mmol/L (ref 0.0–2.0)
Bicarbonate: 22.5 mmol/L (ref 20.0–28.0)
Calcium, Ion: 1.12 mmol/L — ABNORMAL LOW (ref 1.15–1.40)
HCT: 47 % (ref 39.0–52.0)
Hemoglobin: 16 g/dL (ref 13.0–17.0)
O2 Saturation: 96 %
Potassium: 4.2 mmol/L (ref 3.5–5.1)
Sodium: 135 mmol/L (ref 135–145)
TCO2: 24 mmol/L (ref 22–32)
pCO2, Ven: 35.7 mmHg — ABNORMAL LOW (ref 44–60)
pH, Ven: 7.406 (ref 7.25–7.43)
pO2, Ven: 78 mmHg — ABNORMAL HIGH (ref 32–45)

## 2022-01-08 LAB — COMPREHENSIVE METABOLIC PANEL
ALT: 20 U/L (ref 0–44)
AST: 21 U/L (ref 15–41)
Albumin: 3.8 g/dL (ref 3.5–5.0)
Alkaline Phosphatase: 88 U/L (ref 38–126)
Anion gap: 11 (ref 5–15)
BUN: 17 mg/dL (ref 6–20)
CO2: 24 mmol/L (ref 22–32)
Calcium: 9.1 mg/dL (ref 8.9–10.3)
Chloride: 101 mmol/L (ref 98–111)
Creatinine, Ser: 1.83 mg/dL — ABNORMAL HIGH (ref 0.61–1.24)
GFR, Estimated: 50 mL/min — ABNORMAL LOW (ref >60)
Glucose, Bld: 355 mg/dL — ABNORMAL HIGH (ref 70–99)
Potassium: 3.8 mmol/L (ref 3.5–5.1)
Sodium: 136 mmol/L (ref 135–145)
Total Bilirubin: 1 mg/dL (ref 0.3–1.2)
Total Protein: 7.1 g/dL (ref 6.5–8.1)

## 2022-01-08 LAB — URINALYSIS, ROUTINE W REFLEX MICROSCOPIC
Bacteria, UA: NONE SEEN
Bilirubin Urine: NEGATIVE
Glucose, UA: >=500 mg/dL — AB
Ketones, ur: NEGATIVE mg/dL
Leukocytes,Ua: NEGATIVE
Nitrite: NEGATIVE
Protein, ur: >=300 mg/dL — AB
Specific Gravity, Urine: 1.02 (ref 1.005–1.030)
pH: 6 (ref 5.0–8.0)

## 2022-01-08 LAB — BETA-HYDROXYBUTYRIC ACID: Beta-Hydroxybutyric Acid: 0.07 mmol/L (ref 0.05–0.27)

## 2022-01-08 LAB — LIPASE, BLOOD: Lipase: 44 U/L (ref 11–51)

## 2022-01-08 LAB — CBG MONITORING, ED: Glucose-Capillary: 414 mg/dL — ABNORMAL HIGH (ref 70–99)

## 2022-01-08 MED ORDER — SODIUM CHLORIDE 0.9 % IV BOLUS
1000.0000 mL | Freq: Once | INTRAVENOUS | Status: AC
  Administered 2022-01-08: 1000 mL via INTRAVENOUS

## 2022-01-08 MED ORDER — ONDANSETRON HCL 4 MG PO TABS: 4.0000 mg | ORAL_TABLET | Freq: Four times a day (QID) | ORAL | 0 refills | Status: AC

## 2022-01-08 MED ORDER — ONDANSETRON HCL 4 MG/2ML IJ SOLN
4.0000 mg | Freq: Once | INTRAMUSCULAR | Status: AC
  Administered 2022-01-08: 4 mg via INTRAVENOUS
  Filled 2022-01-08: qty 2

## 2022-01-08 MED ORDER — LISINOPRIL 10 MG PO TABS
10.0000 mg | ORAL_TABLET | Freq: Once | ORAL | Status: AC
  Administered 2022-01-08: 10 mg via ORAL
  Filled 2022-01-08: qty 1

## 2022-01-08 NOTE — ED Provider Notes (Signed)
[facility] EMERGENCY DEPARTMENT Provider Note   CSN: [id number] Arrival date & time: 01/08/22  1215     History  Chief Complaint  Patient presents with   Nausea    Antonio Martin is a 31 y.o. male.  HPI  Patient is a 31 year old male, he has a history of hypertension and type 1 diabetes on insulin 4 times a day.  He was recently visiting his family in [place] where he has been for the last 2-1/2 months, he states that while he was there he was doing his normal usual activities and visiting with family.  He did develop diarrhea almost the entire time he was there but since coming back to the [place] 2 days ago he has been in good health with regards to diarrhea however he has had multiple episodes of nausea and vomiting which occur only when he eats.  He is not nauseated at this time, he is not having any chest pain coughing or shortness of breath, no fevers or chills, he does not have any swelling or rashes on his skin and does not believe that he developed any infections or had any bug bites.  He did fall at 1 point striking his left elbow while he was playing soccer with his neighbors.  He does report having a headache which has been going on over the last few days as well with the nausea but denies any head injuries.  He has had no medications prior to arrival.  He had a small amount of food this morning but he vomited, he did take his insulin this morning  Home Medications Prior to Admission medications   Medication Sig Start Date End Date Taking? Authorizing Provider  ondansetron (ZOFRAN) 4 MG tablet Take 1 tablet (4 mg total) by mouth every 6 (six) hours. 01/08/22  Yes Eber Hong, MD  cyclobenzaprine (FLEXERIL) 10 MG tablet Take 1 tablet (10 mg total) by mouth 2 (two) times daily as needed for muscle spasms. 05/21/21   Alvira Monday, MD  GLUCAGON EMERGENCY 1 MG injection Inject 1 mg into the skin as directed.  10/04/18   [provider]   Insulin Human (INSULIN PUMP) SOLN Inject 1 each into the skin continuous. Uses Novolog  70 units total daily    [provider]  lisinopril (PRINIVIL,ZESTRIL) 10 MG tablet Take 10 mg by mouth daily. 11/23/17   [provider]  naproxen (NAPROSYN) 500 MG tablet Take 1 tablet (500 mg total) by mouth 2 (two) times daily as needed for moderate pain. 06/02/20   Petrucelli, Samantha R, PA-C  NOVOLOG 100 UNIT/ML injection 70 Units daily. Use in insulin pump 10/03/18   [provider]  predniSONE (DELTASONE) 10 MG tablet Takes 3 tablets for 1 days, then 2 tabs for 1 days, then 1 tab for 1 days, and then stop. Patient not taking: Reported on 12/14/2018 12/13/17   Marinda Elk, MD  albuterol (PROVENTIL HFA;VENTOLIN HFA) 108 [phone] Base) MCG/ACT inhaler Inhale 1-2 puffs into the lungs every 6 (six) hours as needed for wheezing or shortness of breath. Patient not taking: Reported on 08/16/2019 12/07/17 06/02/20  Lorre Nick, MD      Allergies    Tape    Review of Systems   Review of Systems  All other systems reviewed and are negative.  Physical Exam Updated Vital Signs BP (!) 180/95 (BP Location: Right Arm)   Pulse 83   Temp 97.7 F (36.5 C) (Oral)   Resp 18  Ht 1.6 m (5\' 3" )   Wt 62.6 kg   SpO2 98%   BMI 24.45 kg/m  Physical Exam Vitals and nursing note reviewed.  Constitutional:      General: He is not in acute distress.    Appearance: He is well-developed.  HENT:     Head: Normocephalic and atraumatic.     Nose: No congestion or rhinorrhea.     Mouth/Throat:     Mouth: Mucous membranes are moist.     Pharynx: No oropharyngeal exudate.  Eyes:     General: No scleral icterus.       Right eye: No discharge.        Left eye: No discharge.     Conjunctiva/sclera: Conjunctivae normal.     Pupils: Pupils are equal, round, and reactive to light.  Neck:     Thyroid: No thyromegaly.     Vascular: No JVD.  Cardiovascular:     Rate and Rhythm: Normal rate  and regular rhythm.     Heart sounds: Normal heart sounds. No murmur heard.   No friction rub. No gallop.  Pulmonary:     Effort: Pulmonary effort is normal. No respiratory distress.     Breath sounds: Normal breath sounds. No wheezing or rales.  Abdominal:     General: Bowel sounds are normal. There is no distension.     Palpations: Abdomen is soft. There is no mass.     Tenderness: There is no abdominal tenderness.     Comments: Totally nontender abdomen  Musculoskeletal:        General: No tenderness. Normal range of motion.     Cervical back: Normal range of motion and neck supple.     Right lower leg: No edema.     Left lower leg: No edema.  Lymphadenopathy:     Cervical: No cervical adenopathy.  Skin:    General: Skin is warm and dry.     Findings: No erythema or rash.  Neurological:     General: No focal deficit present.     Mental Status: He is alert.     Coordination: Coordination normal.     Comments: Moves all 4 extremities with normal range of motion normal sensation normal speech normal cranial nerves III through XII  Psychiatric:        Behavior: Behavior normal.    ED Results / Procedures / Treatments   Labs (all labs ordered are listed, but only abnormal results are displayed) Labs Reviewed  COMPREHENSIVE METABOLIC PANEL - Abnormal; Notable for the following components:      Result Value   Glucose, Bld 355 (*)    Creatinine, Ser 1.83 (*)    GFR, Estimated 50 (*)    All other components within normal limits  CBC WITH DIFFERENTIAL/PLATELET - Abnormal; Notable for the following components:   Hemoglobin 17.6 (*)    Eosinophils Absolute 1.0 (*)    All other components within normal limits  URINALYSIS, ROUTINE W REFLEX MICROSCOPIC - Abnormal; Notable for the following components:   Glucose, UA >=500 (*)    Hgb urine dipstick SMALL (*)    Protein, ur >=300 (*)    All other components within normal limits  I-STAT VENOUS BLOOD GAS, ED - Abnormal; Notable for  the following components:   pCO2, Ven 35.7 (*)    pO2, Ven 78 (*)    Calcium, Ion 1.12 (*)    All other components within normal limits  CBG MONITORING, ED - Abnormal; Notable for  the following components:   Glucose-Capillary 414 (*)    All other components within normal limits  RESP PANEL BY RT-PCR (FLU A&B, COVID) ARPGX2  LIPASE, BLOOD  BETA-HYDROXYBUTYRIC ACID  RAPID URINE DRUG SCREEN, HOSP PERFORMED    EKG EKG Interpretation  Date/Time:  Friday January 08 2022 12:43:45 EDT Ventricular Rate:  100 PR Interval:  138 QRS Duration: 80 QT Interval:  334 QTC Calculation: 430 R Axis:   101 Text Interpretation: Normal sinus rhythm Right atrial enlargement Rightward axis Cannot rule out Anterior infarct , age undetermined Abnormal ECG When compared with ECG of 01-Jun-2018 11:48, PREVIOUS ECG IS PRESENT Confirmed by Eber Hong ([id number]) on 01/08/2022 5:05:00 PM  Radiology CT Head Wo Contrast  Result Date: 01/08/2022 CLINICAL DATA:  New or worsening headache. Vomiting. Recent travel to [place]. EXAM: CT HEAD WITHOUT CONTRAST TECHNIQUE: Contiguous axial images were obtained from the base of the skull through the vertex without intravenous contrast. RADIATION DOSE REDUCTION: This exam was performed according to the departmental dose-optimization program which includes automated exposure control, adjustment of the mA and/or kV according to patient size and/or use of iterative reconstruction technique. COMPARISON:  None. FINDINGS: Brain: The ventricles are normal in size and configuration. The basilar cisterns are patent. No mass, mass effect, or midline shift. No acute intracranial hemorrhage is seen. No abnormal extra-axial fluid collection. Preservation of the normal cortical gray-white interface without CT evidence of an acute major vascular territorial cortical based infarction. Vascular: No hyperdense vessel or unexpected calcification. Skull: Normal. Negative for fracture or focal lesion.  Sinuses/Orbits: The visualized orbits are unremarkable. The visualized paranasal sinuses and mastoid air cells are clear. Other: None. IMPRESSION: No acute intracranial process. Electronically Signed   By: Neita Garnet M.D.   On: 01/08/2022 19:17    Procedures Procedures    Medications Ordered in ED Medications  ondansetron (ZOFRAN) injection 4 mg (4 mg Intravenous Given 01/08/22 1809)  sodium chloride 0.9 % bolus 1,000 mL (0 mLs Intravenous Stopped 01/08/22 1852)  lisinopril (ZESTRIL) tablet 10 mg (10 mg Oral Given 01/08/22 1911)    ED Course/ Medical Decision Making/ A&P                           Medical Decision Making Amount and/or Complexity of Data Reviewed Radiology: ordered.  Risk Prescription drug management.   This patient presents to the ED for concern of intermittent nausea and headache, this involves an extensive number of treatment options, and is a complaint that carries with it a high risk of complications and morbidity.  The differential diagnosis includes concussion, migraine headache, primary gastrointestinal abnormalities, infection, travel sickness   Co morbidities that complicate the patient evaluation  Type 1 diabetes, hypertension, history of renal insufficiency   Additional history obtained:  Additional history obtained from electronic medical record External records from outside source obtained and reviewed including prior labs that show that the patient has had some chronic renal insufficiency and his creatinine today is slightly above what it usually is   Lab Tests:  I Ordered, and personally interpreted labs.  The pertinent results include: Hyperglycemia, chronic renal insufficiency not far from baseline.  Urinalysis shows glucose, protein but no signs of infection or ketonuria   Imaging Studies ordered:  I ordered imaging studies including CT scan of the brain I independently visualized and interpreted imaging which showed no acute findings to  suggest a cause of nausea I agree with the radiologist interpretation  Cardiac Monitoring: / EKG:  The patient was maintained on a cardiac monitor.  I personally viewed and interpreted the cardiac monitored which showed an underlying rhythm of: Normal sinus rhythm   Problem List / ED Course / Critical interventions / Medication management  Zofran, IV fluids The patient improved significantly, CT scan of the brain showed no acute findings, patient informed of results, given prescription for Zofran for home I ordered medication including Zofran and IV fluids for nausea and relative dehydration as well as his hyperglycemia Reevaluation of the patient after these medicines showed that the patient improved I have reviewed the patients home medicines and have made adjustments as needed   Test / Admission - Considered:  Consider further testing such as stool studies but he states that the diarrhea has all but stopped.   Patient appears stable for discharge, agreeable to return should symptoms worsen         Final Clinical Impression(s) / ED Diagnoses Final diagnoses:  Nauseated    Rx / DC Orders ED Discharge Orders          Ordered    ondansetron (ZOFRAN) 4 MG tablet  Every 6 hours        01/08/22 1959              Eber Hong, MD 01/08/22 2000

## 2022-01-08 NOTE — Discharge Instructions (Signed)
Please pick up some omeprazole from your pharmacy, take this once a day at the same time every day. ? ?You may take Zofran 1 tablet every 6 hours as needed for nausea ? ?Drink plenty of clear liquids ? ?Please take your blood pressure medication when you get home ? ?Return to the emergency department for severe worsening symptoms otherwise see your doctor in 2 to 3 days. ?

## 2022-01-08 NOTE — ED Notes (Signed)
Notified Dr. Sabra Heck of BP 193/99. Pt takes Lisinopril 10 mg. Pt has not taken BP meds today. Asymptomatic. Given verbal to order home dose of medications.  ?

## 2022-01-08 NOTE — ED Provider Triage Note (Signed)
Emergency Medicine Provider Triage Evaluation Note ? ?Antonio Martin , a 31 y.o. male  was evaluated in triage.  Pt complains of nausea and vomiting.  He states that he just from 2 events in [place].  Over the past 2 weeks he has had nausea and vomiting.  He states he has had difficulty tolerating food.  He reports he is still checking his sugars and taking his insulin.  He also reports headache. ? ? ?Physical Exam  ?BP (!) 191/99 (BP Location: Right Arm)   Pulse (!) 106   Temp 98.2 ?F (36.8 ?C) (Oral)   Resp 18   Ht 5\' 3"  (1.6 m)   Wt 62.6 kg   SpO2 98%   BMI 24.45 kg/m?  ?Gen:   Awake, no distress   ?Resp:  Normal effort  ?MSK:   Moves extremities without difficulty  ?Other:  Normal speech.  ? ?Medical Decision Making  ?Medically screening exam initiated at 12:50 PM.  Appropriate orders placed.  Brien Robnett was informed that the remainder of the evaluation will be completed by another provider, this initial triage assessment does not replace that evaluation, and the importance of remaining in the ED until their evaluation is complete. ? ?Note: Portions of this report may have been transcribed using voice recognition software. Every effort was made to ensure accuracy; however, inadvertent computerized transcription errors may be present ? ?  ?Lorin Glass, [place] ?01/08/22 1251 ? ?

## 2022-01-08 NOTE — ED Triage Notes (Signed)
Pt arrived POV c/o N?V x 1 week. Pt states any time he tries to eat he gets sick. Pt also states he just got back from [place] yesterday. Pt endorses a headache.  ?

## 2022-01-19 ENCOUNTER — Other Ambulatory Visit: Payer: Self-pay | Admitting: Internal Medicine

## 2022-01-20 LAB — COMPLETE METABOLIC PANEL WITH GFR
AG Ratio: 1.6 (calc) (ref 1.0–2.5)
ALT: 17 U/L (ref 9–46)
AST: 15 U/L (ref 10–40)
Albumin: 3.9 g/dL (ref 3.6–5.1)
Alkaline phosphatase (APISO): 89 U/L (ref 36–130)
BUN/Creatinine Ratio: 13 (calc) (ref 6–22)
BUN: 28 mg/dL — ABNORMAL HIGH (ref 7–25)
CO2: 19 mmol/L — ABNORMAL LOW (ref 20–32)
Calcium: 8.9 mg/dL (ref 8.6–10.3)
Chloride: 105 mmol/L (ref 98–110)
Creat: 2.09 mg/dL — ABNORMAL HIGH (ref 0.60–1.26)
Globulin: 2.5 g/dL (calc) (ref 1.9–3.7)
Glucose, Bld: 238 mg/dL — ABNORMAL HIGH (ref 65–99)
Potassium: 4.3 mmol/L (ref 3.5–5.3)
Sodium: 136 mmol/L (ref 135–146)
Total Bilirubin: 0.4 mg/dL (ref 0.2–1.2)
Total Protein: 6.4 g/dL (ref 6.1–8.1)
eGFR: 43 mL/min/{1.73_m2} — ABNORMAL LOW (ref >=60)

## 2022-01-20 LAB — CBC
HCT: 45.5 % (ref 38.5–50.0)
Hemoglobin: 15.4 g/dL (ref 13.2–17.1)
MCH: 30.4 pg (ref 27.0–33.0)
MCHC: 33.8 g/dL (ref 32.0–36.0)
MCV: 89.7 fL (ref 80.0–100.0)
MPV: 10.5 fL (ref 7.5–12.5)
Platelets: 296 10*3/uL (ref 140–400)
RBC: 5.07 10*6/uL (ref 4.20–5.80)
RDW: 11.7 % (ref 11.0–15.0)
WBC: 8.1 10*3/uL (ref 3.8–10.8)

## 2022-01-20 LAB — VITAMIN D 25 HYDROXY (VIT D DEFICIENCY, FRACTURES): Vit D, 25-Hydroxy: 22 ng/mL — ABNORMAL LOW (ref 30–100)

## 2022-01-20 LAB — LIPID PANEL
Cholesterol: 167 mg/dL (ref <200)
HDL: 60 mg/dL (ref >=40)
LDL Cholesterol (Calc): 86 mg/dL (calc)
Non-HDL Cholesterol (Calc): 107 mg/dL (calc) (ref <130)
Total CHOL/HDL Ratio: 2.8 (calc) (ref <5.0)
Triglycerides: 114 mg/dL (ref <150)

## 2022-01-20 LAB — TSH: TSH: 3.13 mIU/L (ref 0.40–4.50)

## 2022-06-22 ENCOUNTER — Other Ambulatory Visit: Payer: Self-pay | Admitting: Internal Medicine

## 2022-06-24 LAB — RENAL PROFILE WITH ESTIMATED GFR
Albumin: 4.4 g/dL (ref 3.6–5.1)
BUN/Creatinine Ratio: 14 (calc) (ref 6–22)
BUN: 33 mg/dL — ABNORMAL HIGH (ref 7–25)
CO2: 18 mmol/L — ABNORMAL LOW (ref 20–32)
Calcium: 9.2 mg/dL (ref 8.6–10.3)
Chloride: 106 mmol/L (ref 98–110)
Creat: 2.32 mg/dL — ABNORMAL HIGH (ref 0.60–1.26)
Glucose, Bld: 185 mg/dL — ABNORMAL HIGH (ref 65–99)
Phosphorus: 3.9 mg/dL (ref 2.5–4.5)
Potassium: 5.1 mmol/L (ref 3.5–5.3)
Sodium: 135 mmol/L (ref 135–146)
eGFR: 38 mL/min/{1.73_m2} — ABNORMAL LOW (ref >=60)

## 2022-06-24 LAB — EXTRA LAV TOP TUBE

## 2022-06-24 LAB — VITAMIN D 25 HYDROXY (VIT D DEFICIENCY, FRACTURES): Vit D, 25-Hydroxy: 20 ng/mL — ABNORMAL LOW (ref 30–100)

## 2022-06-24 LAB — TESTOSTERONE, FREE & TOTAL

## 2022-10-03 ENCOUNTER — Emergency Department (HOSPITAL_COMMUNITY): Payer: Medicaid Other

## 2022-10-03 ENCOUNTER — Emergency Department (HOSPITAL_COMMUNITY)
Admission: EM | Admit: 2022-10-03 | Discharge: 2022-10-04 | Disposition: A | Discharge status: Home / Self Care | Payer: Medicaid Other | Attending: Emergency Medicine | Admitting: Emergency Medicine

## 2022-10-03 DIAGNOSIS — R197 Diarrhea, unspecified: Secondary | ICD-10-CM | POA: Diagnosis not present

## 2022-10-03 DIAGNOSIS — E1065 Type 1 diabetes mellitus with hyperglycemia: Secondary | ICD-10-CM | POA: Diagnosis not present

## 2022-10-03 DIAGNOSIS — Z794 Long term (current) use of insulin: Secondary | ICD-10-CM | POA: Insufficient documentation

## 2022-10-03 DIAGNOSIS — R739 Hyperglycemia, unspecified: Secondary | ICD-10-CM

## 2022-10-03 DIAGNOSIS — R059 Cough, unspecified: Secondary | ICD-10-CM | POA: Insufficient documentation

## 2022-10-03 DIAGNOSIS — R5383 Other fatigue: Secondary | ICD-10-CM | POA: Insufficient documentation

## 2022-10-03 DIAGNOSIS — I10 Essential (primary) hypertension: Secondary | ICD-10-CM | POA: Insufficient documentation

## 2022-10-03 DIAGNOSIS — Z1152 Encounter for screening for COVID-19: Secondary | ICD-10-CM | POA: Diagnosis not present

## 2022-10-03 DIAGNOSIS — R051 Acute cough: Secondary | ICD-10-CM

## 2022-10-03 LAB — URINALYSIS, ROUTINE W REFLEX MICROSCOPIC
Bacteria, UA: NONE SEEN
Bilirubin Urine: NEGATIVE
Glucose, UA: 50 mg/dL — AB
Hgb urine dipstick: NEGATIVE
Ketones, ur: NEGATIVE mg/dL
Leukocytes,Ua: NEGATIVE
Nitrite: NEGATIVE
Protein, ur: 100 mg/dL — AB
Specific Gravity, Urine: 1.012 (ref 1.005–1.030)
pH: 5 (ref 5.0–8.0)

## 2022-10-03 LAB — I-STAT VENOUS BLOOD GAS, ED
Acid-base deficit: 6 mmol/L — ABNORMAL HIGH (ref 0.0–2.0)
Bicarbonate: 20.7 mmol/L (ref 20.0–28.0)
Calcium, Ion: 1.28 mmol/L (ref 1.15–1.40)
HCT: 38 % — ABNORMAL LOW (ref 39.0–52.0)
Hemoglobin: 12.9 g/dL — ABNORMAL LOW (ref 13.0–17.0)
O2 Saturation: 46 %
Potassium: 5.3 mmol/L — ABNORMAL HIGH (ref 3.5–5.1)
Sodium: 138 mmol/L (ref 135–145)
TCO2: 22 mmol/L (ref 22–32)
pCO2, Ven: 46.1 mmHg (ref 44–60)
pH, Ven: 7.26 (ref 7.25–7.43)
pO2, Ven: 29 mmHg — CL (ref 32–45)

## 2022-10-03 LAB — CBC WITH DIFFERENTIAL/PLATELET
Abs Immature Granulocytes: 0.01 10*3/uL (ref 0.00–0.07)
Basophils Absolute: 0.1 10*3/uL (ref 0.0–0.1)
Basophils Relative: 1 %
Eosinophils Absolute: 0.9 10*3/uL — ABNORMAL HIGH (ref 0.0–0.5)
Eosinophils Relative: 18 %
HCT: 36.6 % — ABNORMAL LOW (ref 39.0–52.0)
Hemoglobin: 12.9 g/dL — ABNORMAL LOW (ref 13.0–17.0)
Immature Granulocytes: 0 %
Lymphocytes Relative: 21 %
Lymphs Abs: 1 10*3/uL (ref 0.7–4.0)
MCH: 30.6 pg (ref 26.0–34.0)
MCHC: 35.2 g/dL (ref 30.0–36.0)
MCV: 86.7 fL (ref 80.0–100.0)
Monocytes Absolute: 0.6 10*3/uL (ref 0.1–1.0)
Monocytes Relative: 12 %
Neutro Abs: 2.4 10*3/uL (ref 1.7–7.7)
Neutrophils Relative %: 48 %
Platelets: 256 10*3/uL (ref 150–400)
RBC: 4.22 MIL/uL (ref 4.22–5.81)
RDW: 11.8 % (ref 11.5–15.5)
WBC: 5 10*3/uL (ref 4.0–10.5)
nRBC: 0 % (ref 0.0–0.2)

## 2022-10-03 LAB — COMPREHENSIVE METABOLIC PANEL
ALT: 24 U/L (ref 0–44)
AST: 20 U/L (ref 15–41)
Albumin: 4.2 g/dL (ref 3.5–5.0)
Alkaline Phosphatase: 117 U/L (ref 38–126)
Anion gap: 6 (ref 5–15)
BUN: 49 mg/dL — ABNORMAL HIGH (ref 6–20)
CO2: 22 mmol/L (ref 22–32)
Calcium: 9.1 mg/dL (ref 8.9–10.3)
Chloride: 108 mmol/L (ref 98–111)
Creatinine, Ser: 2.15 mg/dL — ABNORMAL HIGH (ref 0.61–1.24)
GFR, Estimated: 41 mL/min — ABNORMAL LOW (ref >60)
Glucose, Bld: 253 mg/dL — ABNORMAL HIGH (ref 70–99)
Potassium: 5.1 mmol/L (ref 3.5–5.1)
Sodium: 136 mmol/L (ref 135–145)
Total Bilirubin: 0.6 mg/dL (ref 0.3–1.2)
Total Protein: 7.5 g/dL (ref 6.5–8.1)

## 2022-10-03 LAB — RESP PANEL BY RT-PCR (RSV, FLU A&B, COVID)  RVPGX2
Influenza A by PCR: NEGATIVE
Influenza B by PCR: NEGATIVE
Resp Syncytial Virus by PCR: NEGATIVE
SARS Coronavirus 2 by RT PCR: NEGATIVE

## 2022-10-03 LAB — BETA-HYDROXYBUTYRIC ACID: Beta-Hydroxybutyric Acid: 0.1 mmol/L (ref 0.05–0.27)

## 2022-10-03 LAB — CBG MONITORING, ED
Glucose-Capillary: 248 mg/dL — ABNORMAL HIGH (ref 70–99)
Glucose-Capillary: 78 mg/dL (ref 70–99)

## 2022-10-03 NOTE — ED Triage Notes (Signed)
Patient here for evaluation of fatigue, cough, and hyperglycemia that started four days ago. Denies vomit and diarrhea. Patient is alert, oriented, and in no apparent distress at this time.

## 2022-10-03 NOTE — ED Provider Triage Note (Signed)
Emergency Medicine Provider Triage Evaluation Note  Antonio Martin , a 31 y.o. male  was evaluated in triage.  Pt complains of cough, fatigue, and hyperglycemia x 4 days.  Patient states his glucose has been elevated in the 200s.  Typically runs in the lower 100s.  Denies abdominal pain, nausea, or vomiting.  History of type 1 diabetes.  Taking his insulin as prescribed.  Review of Systems  Positive: Cough, fatigue Negative: fever  Physical Exam  BP (!) 151/79 (BP Location: Right Arm)   Pulse (!) 103   Temp 97.9 F (36.6 C) (Oral)   Resp 18   SpO2 98%  Gen:   Awake, no distress   Resp:  Normal effort  MSK:   Moves extremities without difficulty  Other:    Medical Decision Making  Medically screening exam initiated at 3:49 PM.  Appropriate orders placed.  Antonio Martin was informed that the remainder of the evaluation will be completed by another provider, this initial triage assessment does not replace that evaluation, and the importance of remaining in the ED until their evaluation is complete.  Hyperglycemia labs CXR COVID/flu   Mannie Stabile, PA-C 10/03/22 1550

## 2022-10-04 NOTE — Discharge Instructions (Signed)
You were seen in the emergency department today for cough and elevated blood sugar.  I suspect you likely have a respiratory virus. You can continue taking cough medicine.   You can take ibuprofen or Tylenol for pain or fever, and I recommend alternating between the 2.  Make sure that you are drinking lots of fluids and getting plenty of rest. You can take decongestants as long as you take them with lots of water. You can use lozenges or chloraseptic spray as needed for sore throat.   Please use Tylenol or ibuprofen for pain.  You may use 600 mg ibuprofen every 6 hours or 1000 mg of Tylenol every 6 hours.  You may choose to alternate between the 2.  This would be most effective.  Do not exceed 4 g of Tylenol within 24 hours.  Do not exceed 3200 mg ibuprofen within 24 hours.  Continue to monitor how you are doing, and return to the emergency department for new or worsening symptoms such as chest pain, difficulty breathing not related to coughing, fever despite medication, or persistent vomiting or diarrhea.  Make sure you're keeping close record of your blood sugar readings to discuss with your primary doctor.   It has been a pleasure taking care of you today and I hope you begin to feel better soon!

## 2022-10-04 NOTE — ED Provider Notes (Signed)
[facility] EMERGENCY DEPARTMENT Provider Note   CSN: [id number] Arrival date & time: 10/03/22  1527     History  Chief Complaint  Patient presents with   Cough    Antonio Martin is a 31 y.o. male with history of T1DM and HTN who presents to the emergency department for fatigue, nonproductive cough, and elevated blood sugars for the past 4 days. Reports taking his insulin as prescribed, blood sugars have been in the 200s, highest in the 400s. Intermittent episodes of nonbloody diarrhea. Denies fever, nausea, or vomiting. Has been taking over the counter cough medicine.   Offered language interpreter and patient declined.    Cough Associated symptoms: sore throat   Associated symptoms: no chest pain, no chills, no fever and no shortness of breath        Home Medications Prior to Admission medications   Medication Sig Start Date End Date Taking? Authorizing Provider  cyclobenzaprine (FLEXERIL) 10 MG tablet Take 1 tablet (10 mg total) by mouth 2 (two) times daily as needed for muscle spasms. 05/21/21   Alvira Monday, MD  GLUCAGON EMERGENCY 1 MG injection Inject 1 mg into the skin as directed.  10/04/18   [provider]  Insulin Human (INSULIN PUMP) SOLN Inject 1 each into the skin continuous. Uses Novolog  70 units total daily    [provider]  lisinopril (PRINIVIL,ZESTRIL) 10 MG tablet Take 10 mg by mouth daily. 11/23/17   [provider]  naproxen (NAPROSYN) 500 MG tablet Take 1 tablet (500 mg total) by mouth 2 (two) times daily as needed for moderate pain. 06/02/20   Petrucelli, Samantha R, PA-C  NOVOLOG 100 UNIT/ML injection 70 Units daily. Use in insulin pump 10/03/18   [provider]  ondansetron (ZOFRAN) 4 MG tablet Take 1 tablet (4 mg total) by mouth every 6 (six) hours. 01/08/22   Eber Hong, MD  predniSONE (DELTASONE) 10 MG tablet Takes 3 tablets for 1 days, then 2 tabs for 1 days, then 1 tab for 1 days, and  then stop. Patient not taking: Reported on 12/14/2018 12/13/17   Marinda Elk, MD  albuterol (PROVENTIL HFA;VENTOLIN HFA) 108 [phone] Base) MCG/ACT inhaler Inhale 1-2 puffs into the lungs every 6 (six) hours as needed for wheezing or shortness of breath. Patient not taking: Reported on 08/16/2019 12/07/17 06/02/20  Lorre Nick, MD      Allergies    Tape    Review of Systems   Review of Systems  Constitutional:  Negative for chills and fever.  HENT:  Positive for sore throat.   Respiratory:  Positive for cough. Negative for shortness of breath.   Cardiovascular:  Negative for chest pain.  Gastrointestinal:  Positive for diarrhea. Negative for blood in stool, nausea and vomiting.  All other systems reviewed and are negative.   Physical Exam Updated Vital Signs BP (!) 156/90   Pulse 91   Temp 98 F (36.7 C) (Oral)   Resp 16   SpO2 99%  Physical Exam Vitals and nursing note reviewed.  Constitutional:      Appearance: Normal appearance.  HENT:     Head: Normocephalic and atraumatic.  Eyes:     Conjunctiva/sclera: Conjunctivae normal.  Cardiovascular:     Rate and Rhythm: Normal rate and regular rhythm.  Pulmonary:     Effort: Pulmonary effort is normal. No respiratory distress.     Breath sounds: Normal breath sounds.  Abdominal:     General: There is no distension.  Palpations: Abdomen is soft.     Tenderness: There is no abdominal tenderness.  Skin:    General: Skin is warm and dry.  Neurological:     General: No focal deficit present.     Mental Status: He is alert.     ED Results / Procedures / Treatments   Labs (all labs ordered are listed, but only abnormal results are displayed) Labs Reviewed  CBC WITH DIFFERENTIAL/PLATELET - Abnormal; Notable for the following components:      Result Value   Hemoglobin 12.9 (*)    HCT 36.6 (*)    Eosinophils Absolute 0.9 (*)    All other components within normal limits  COMPREHENSIVE METABOLIC PANEL - Abnormal;  Notable for the following components:   Glucose, Bld 253 (*)    BUN 49 (*)    Creatinine, Ser 2.15 (*)    GFR, Estimated 41 (*)    All other components within normal limits  URINALYSIS, ROUTINE W REFLEX MICROSCOPIC - Abnormal; Notable for the following components:   Color, Urine STRAW (*)    Glucose, UA 50 (*)    Protein, ur 100 (*)    All other components within normal limits  CBG MONITORING, ED - Abnormal; Notable for the following components:   Glucose-Capillary 248 (*)    All other components within normal limits  I-STAT VENOUS BLOOD GAS, ED - Abnormal; Notable for the following components:   pO2, Ven 29 (*)    Acid-base deficit 6.0 (*)    Potassium 5.3 (*)    HCT 38.0 (*)    Hemoglobin 12.9 (*)    All other components within normal limits  RESP PANEL BY RT-PCR (RSV, FLU A&B, COVID)  RVPGX2  BETA-HYDROXYBUTYRIC ACID  CBG MONITORING, ED    EKG None  Radiology DG Chest 1 View  Result Date: 10/03/2022 CLINICAL DATA:  Cough. EXAM: CHEST  1 VIEW COMPARISON:  Chest radiograph dated June 01, 2018 FINDINGS: The heart size and mediastinal contours are within normal limits. Both lungs are clear. The visualized skeletal structures are unremarkable. IMPRESSION: No active disease. Electronically Signed   By: Larose Hires D.O.   On: 10/03/2022 16:19    Procedures Procedures    Medications Ordered in ED Medications - No data to display  ED Course/ Medical Decision Making/ A&P                           Medical Decision Making  This patient is a 31 y.o. male  who presents to the ED for concern of cough and elevated blood sugars.  Differential diagnoses prior to evaluation: The emergent differential diagnosis includes, but is not limited to,  upper respiratory infection, lower respiratory infection, allergies, asthma, irritants, sinus/esophageal foreign body, medications, reflux, CHF, lung cancer, interstitial lung disease, postnasal drip, viral illness, DKA, HHS.  This is not an  exhaustive differential.   Past Medical History / Co-morbidities: HTN, T1DM  Physical Exam: Physical exam performed. The pertinent findings include: Hypertensive, but otherwise normal vital signs. Lung sounds clear with normal work of breathing, normal oxygen saturation on room air.  Lab Tests/Imaging studies: I personally interpreted labs/imaging and the pertinent results include:  No leukocytosis. Elevated glucose without evidence of DKA. Increased creatinine, but appears stable compared to prior. Normal pH on VBG. UA unremarkable. Respiratory panel negative for COVID, flu and RSV.   Chest x-ray without acute cardiopulmonary abnormalities. I agree with the radiologist interpretation.  Disposition: After consideration  of the diagnostic results and the patients response to treatment, I feel that emergency department workup does not suggest an emergent condition requiring admission or immediate intervention beyond what has been performed at this time. The plan is: discharge to home with symptomatic management of likely viral respiratory infection. Encouraged OTC medications and keeping close track of blood sugar readings. Not in DKA and blood sugars came down nicely while during 9 1/2 hour time in the ER. He will follow up with his doctor in a few weeks to discuss his diabetes management. The patient is safe for discharge and has been instructed to return immediately for worsening symptoms, change in symptoms or any other concerns.  Final Clinical Impression(s) / ED Diagnoses Final diagnoses:  None    Rx / DC Orders ED Discharge Orders     None      Portions of this report may have been transcribed using voice recognition software. Every effort was made to ensure accuracy; however, inadvertent computerized transcription errors may be present.    Jeanella Flattery 10/04/22 0054    Tilden Fossa, MD 10/04/22 [phone]

## 2022-10-04 NOTE — ED Notes (Signed)
Patient refused d/c vital signs. 

## 2022-11-05 ENCOUNTER — Other Ambulatory Visit: Payer: Self-pay | Admitting: Nephrology

## 2022-11-05 DIAGNOSIS — I129 Hypertensive chronic kidney disease with stage 1 through stage 4 chronic kidney disease, or unspecified chronic kidney disease: Secondary | ICD-10-CM

## 2022-11-05 DIAGNOSIS — E1029 Type 1 diabetes mellitus with other diabetic kidney complication: Secondary | ICD-10-CM

## 2022-11-05 DIAGNOSIS — N1832 Chronic kidney disease, stage 3b: Secondary | ICD-10-CM

## 2022-11-05 DIAGNOSIS — E1022 Type 1 diabetes mellitus with diabetic chronic kidney disease: Secondary | ICD-10-CM

## 2022-11-15 ENCOUNTER — Ambulatory Visit
Admission: RE | Admit: 2022-11-15 | Discharge: 2022-11-15 | Disposition: A | Discharge status: Home / Self Care | Payer: Medicaid Other | Source: Ambulatory Visit | Attending: Nephrology | Admitting: Nephrology

## 2022-11-15 DIAGNOSIS — E1029 Type 1 diabetes mellitus with other diabetic kidney complication: Secondary | ICD-10-CM

## 2022-11-15 DIAGNOSIS — N1832 Chronic kidney disease, stage 3b: Secondary | ICD-10-CM

## 2022-11-15 DIAGNOSIS — E1022 Type 1 diabetes mellitus with diabetic chronic kidney disease: Secondary | ICD-10-CM

## 2022-11-15 DIAGNOSIS — I129 Hypertensive chronic kidney disease with stage 1 through stage 4 chronic kidney disease, or unspecified chronic kidney disease: Secondary | ICD-10-CM

## 2022-11-17 ENCOUNTER — Other Ambulatory Visit: Payer: Medicaid Other

## 2022-11-25 ENCOUNTER — Other Ambulatory Visit: Payer: Self-pay | Admitting: Internal Medicine

## 2022-11-26 LAB — LIPID PANEL
Cholesterol: 145 mg/dL (ref <200)
HDL: 56 mg/dL (ref >=40)
LDL Cholesterol (Calc): 72 mg/dL (calc)
Non-HDL Cholesterol (Calc): 89 mg/dL (calc) (ref <130)
Total CHOL/HDL Ratio: 2.6 (calc) (ref <5.0)
Triglycerides: 87 mg/dL (ref <150)

## 2022-11-26 LAB — COMPLETE METABOLIC PANEL WITH GFR
AG Ratio: 1.6 (calc) (ref 1.0–2.5)
ALT: 11 U/L (ref 9–46)
AST: 13 U/L (ref 10–40)
Albumin: 4.7 g/dL (ref 3.6–5.1)
Alkaline phosphatase (APISO): 118 U/L (ref 36–130)
BUN/Creatinine Ratio: 18 (calc) (ref 6–22)
BUN: 36 mg/dL — ABNORMAL HIGH (ref 7–25)
CO2: 17 mmol/L — ABNORMAL LOW (ref 20–32)
Calcium: 9.3 mg/dL (ref 8.6–10.3)
Chloride: 104 mmol/L (ref 98–110)
Creat: 2.01 mg/dL — ABNORMAL HIGH (ref 0.60–1.26)
Globulin: 2.9 g/dL (calc) (ref 1.9–3.7)
Glucose, Bld: 362 mg/dL — ABNORMAL HIGH (ref 65–99)
Potassium: 5.5 mmol/L — ABNORMAL HIGH (ref 3.5–5.3)
Sodium: 135 mmol/L (ref 135–146)
Total Bilirubin: 0.5 mg/dL (ref 0.2–1.2)
Total Protein: 7.6 g/dL (ref 6.1–8.1)
eGFR: 44 mL/min/{1.73_m2} — ABNORMAL LOW (ref >=60)

## 2022-11-26 LAB — CBC
HCT: 38.1 % — ABNORMAL LOW (ref 38.5–50.0)
Hemoglobin: 12.9 g/dL — ABNORMAL LOW (ref 13.2–17.1)
MCH: 30.5 pg (ref 27.0–33.0)
MCHC: 33.9 g/dL (ref 32.0–36.0)
MCV: 90.1 fL (ref 80.0–100.0)
MPV: 10.5 fL (ref 7.5–12.5)
Platelets: 290 10*3/uL (ref 140–400)
RBC: 4.23 10*6/uL (ref 4.20–5.80)
RDW: 11.5 % (ref 11.0–15.0)
WBC: 6.3 10*3/uL (ref 3.8–10.8)

## 2022-11-26 LAB — TSH: TSH: 2.87 mIU/L (ref 0.40–4.50)

## 2022-11-26 LAB — VITAMIN D 25 HYDROXY (VIT D DEFICIENCY, FRACTURES): Vit D, 25-Hydroxy: 16 ng/mL — ABNORMAL LOW (ref 30–100)

## 2022-12-24 ENCOUNTER — Encounter (HOSPITAL_COMMUNITY): Payer: Self-pay | Admitting: *Deleted

## 2022-12-24 ENCOUNTER — Emergency Department (HOSPITAL_COMMUNITY)
Admission: EM | Admit: 2022-12-24 | Discharge: 2022-12-24 | Disposition: A | Discharge status: Home / Self Care | Payer: Medicaid Other | Attending: Emergency Medicine | Admitting: Emergency Medicine

## 2022-12-24 ENCOUNTER — Other Ambulatory Visit: Payer: Self-pay

## 2022-12-24 DIAGNOSIS — Z794 Long term (current) use of insulin: Secondary | ICD-10-CM | POA: Insufficient documentation

## 2022-12-24 DIAGNOSIS — B029 Zoster without complications: Secondary | ICD-10-CM

## 2022-12-24 DIAGNOSIS — R21 Rash and other nonspecific skin eruption: Secondary | ICD-10-CM | POA: Diagnosis present

## 2022-12-24 DIAGNOSIS — B0229 Other postherpetic nervous system involvement: Secondary | ICD-10-CM | POA: Diagnosis not present

## 2022-12-24 MED ORDER — LIDOCAINE 5 % EX PTCH
1.0000 | MEDICATED_PATCH | CUTANEOUS | Status: DC
  Administered 2022-12-24: 1 via TRANSDERMAL
  Filled 2022-12-24: qty 1

## 2022-12-24 MED ORDER — IBUPROFEN 600 MG PO TABS: 600.0000 mg | ORAL_TABLET | Freq: Four times a day (QID) | ORAL | 0 refills | Status: AC | PRN

## 2022-12-24 MED ORDER — ACETAMINOPHEN 500 MG PO TABS: 500.0000 mg | ORAL_TABLET | Freq: Four times a day (QID) | ORAL | 0 refills | Status: AC | PRN

## 2022-12-24 MED ORDER — LIDOCAINE 5 % EX PTCH: 1.0000 | MEDICATED_PATCH | CUTANEOUS | 0 refills | Status: AC

## 2022-12-24 NOTE — ED Provider Notes (Signed)
[facility] Provider Note   CSN: [id number] Arrival date & time: 12/24/22  2059     History  Chief Complaint  Patient presents with   Rash    Antonio Martin is a 32 y.o. male presenting today with concern for painful rash.  Reports being diagnosed with shingles earlier this week.  Was prescribed valacyclovir and triamcinolone cream but said that he noticed another spot popped up on his back that he was concerned about.  Does not remember if he had chickenpox as a kid.  Says that it is painful but tolerable.  Pain only occurs when there is friction or pressure on the rash.  Rash      Home Medications Prior to Admission medications   Medication Sig Start Date End Date Taking? Authorizing Provider  acetaminophen (TYLENOL) 500 MG tablet Take 1 tablet (500 mg total) by mouth every 6 (six) hours as needed. 12/24/22  Yes Cielo Arias A, PA-C  ibuprofen (ADVIL) 600 MG tablet Take 1 tablet (600 mg total) by mouth every 6 (six) hours as needed. 12/24/22  Yes Cielle Aguila A, PA-C  lidocaine (LIDODERM) 5 % Place 1 patch onto the skin daily. Remove & Discard patch within 12 hours or as directed by MD 12/24/22  Yes Gillermo Poch A, PA-C  cyclobenzaprine (FLEXERIL) 10 MG tablet Take 1 tablet (10 mg total) by mouth 2 (two) times daily as needed for muscle spasms. 05/21/21   Gareth Morgan, MD  GLUCAGON EMERGENCY 1 MG injection Inject 1 mg into the skin as directed.  10/04/18   [provider]  Insulin Human (INSULIN PUMP) SOLN Inject 1 each into the skin continuous. Uses Novolog  70 units total daily    [provider]  lisinopril (PRINIVIL,ZESTRIL) 10 MG tablet Take 10 mg by mouth daily. 11/23/17   [provider]  naproxen (NAPROSYN) 500 MG tablet Take 1 tablet (500 mg total) by mouth 2 (two) times daily as needed for moderate pain. 06/02/20   Petrucelli, Samantha R, PA-C  NOVOLOG 100 UNIT/ML injection 70 Units daily. Use  in insulin pump 10/03/18   [provider]  ondansetron (ZOFRAN) 4 MG tablet Take 1 tablet (4 mg total) by mouth every 6 (six) hours. 01/08/22   Noemi Chapel, MD  predniSONE (DELTASONE) 10 MG tablet Takes 3 tablets for 1 days, then 2 tabs for 1 days, then 1 tab for 1 days, and then stop. Patient not taking: Reported on 12/14/2018 12/13/17   Charlynne Cousins, MD  albuterol (PROVENTIL HFA;VENTOLIN HFA) 108 [phone] Base) MCG/ACT inhaler Inhale 1-2 puffs into the lungs every 6 (six) hours as needed for wheezing or shortness of breath. Patient not taking: Reported on 08/16/2019 12/07/17 06/02/20  Lacretia Leigh, MD      Allergies    Tape    Review of Systems   Review of Systems  Skin:  Positive for rash.    Physical Exam Updated Vital Signs BP 126/75   Pulse 93   Temp 97.7 F (36.5 C) (Oral)   Resp 16   SpO2 100%  Physical Exam Vitals and nursing note reviewed.  Constitutional:      Appearance: Normal appearance.  HENT:     Head: Normocephalic and atraumatic.  Eyes:     General: No scleral icterus.    Conjunctiva/sclera: Conjunctivae normal.  Pulmonary:     Effort: Pulmonary effort is normal. No respiratory distress.  Skin:    Findings: No rash.  Comments: Papulovesicular rash to the T9 dermatome on the right back.  No drainage.  Neurological:     Mental Status: He is alert.  Psychiatric:        Mood and Affect: Mood normal.     ED Results / Procedures / Treatments   Labs (all labs ordered are listed, but only abnormal results are displayed) Labs Reviewed - No data to display  EKG None  Radiology No results found.  Procedures Procedures   Medications Ordered in ED Medications  lidocaine (LIDODERM) 5 % 1 patch (1 patch Transdermal Patch Applied 12/24/22 2145)    ED Course/ Medical Decision Making/ A&P                             Medical Decision Making Risk OTC drugs. Prescription drug management.   32 year old male presenting today with  concern for painful rash.  He thinks that it may be spreading.  Physical exam is consistent with shingles.  It appears that he is already on valacyclovir 3 times daily which he has at bedside.  He reports that he has been taking this for the past 2 days but he is not sure if there is something else wrong because he noted a new papule.  His rash is not disseminated.  Only involves the T9 dermatome of the right back.  No ocular or ear involvement.  Believe this is mostly a visit for pain control and reassurance.  Patient said he never understood the diagnosis of shingles to begin with.  We discussed the virus and the postherpetic neuralgia.  He voiced understanding and was discharged with information about this.  Return precautions were discussed but he will follow-up outpatient with PCP.  I sent Lidoderm patches, ibuprofen and Tylenol to his pharmacy.  Final Clinical Impression(s) / ED Diagnoses Final diagnoses:  Postherpetic neuralgia  Herpes zoster without complication    Rx / DC Orders ED Discharge Orders          Ordered    ibuprofen (ADVIL) 600 MG tablet  Every 6 hours PRN        12/24/22 2136    lidocaine (LIDODERM) 5 %  Every 24 hours        12/24/22 2136    acetaminophen (TYLENOL) 500 MG tablet  Every 6 hours PRN        12/24/22 2136           Results and diagnoses were explained to the patient. Return precautions discussed in full. Patient had no additional questions and expressed complete understanding.   This chart was dictated using voice recognition software.  Despite best efforts to proofread,  errors can occur which can change the documentation meaning.    Darliss Ridgel 12/24/22 2148    Hayden Rasmussen, MD 12/25/22 1027

## 2022-12-24 NOTE — Discharge Instructions (Addendum)
Please read the information about the pain people experience after shingles.  I suggest to use things over-the-counter such as ibuprofen, Tylenol and lidocaine patches.  I have sent some to the pharmacy but please know if they are too expensive you may purchase patches over-the-counter.  The patches that I sent to your pharmacy can only be worn for 12 hours in a row.  So please be sure to have 12 hours without a patch each day.   Follow-up with your primary or any outpatient clinic for any further concerns but do not hesitate to return to the emergency department with any worsening or recurring episodes.

## 2022-12-24 NOTE — ED Triage Notes (Signed)
Patient with onset of rash on Tuesday.  He has pustule rash on the right side of his abdomen that is now on his back.  He reports he was prescribed antiviral medication and he is taking the medication.  Patient is concerned due to worse pain and the rash spread.  No other complaints

## 2022-12-24 NOTE — ED Notes (Signed)
Patient verbalized understanding of discharge instructions and reasons to return to the ED 

## 2023-02-07 ENCOUNTER — Encounter: Payer: Self-pay | Admitting: *Deleted

## 2023-09-06 ENCOUNTER — Encounter: Payer: Medicaid Other | Attending: Internal Medicine | Admitting: Dietician

## 2023-09-06 ENCOUNTER — Encounter: Payer: Self-pay | Admitting: Internal Medicine

## 2023-09-06 ENCOUNTER — Encounter: Payer: Self-pay | Admitting: Dietician

## 2023-09-06 ENCOUNTER — Ambulatory Visit (INDEPENDENT_AMBULATORY_CARE_PROVIDER_SITE_OTHER): Payer: Medicaid Other | Admitting: Internal Medicine

## 2023-09-06 VITALS — Wt 135.0 lb

## 2023-09-06 VITALS — BP 136/84 | HR 85 | Ht 63.0 in | Wt 136.0 lb

## 2023-09-06 DIAGNOSIS — E1022 Type 1 diabetes mellitus with diabetic chronic kidney disease: Secondary | ICD-10-CM | POA: Insufficient documentation

## 2023-09-06 DIAGNOSIS — E1065 Type 1 diabetes mellitus with hyperglycemia: Secondary | ICD-10-CM

## 2023-09-06 DIAGNOSIS — Z6823 Body mass index (BMI) 23.0-23.9, adult: Secondary | ICD-10-CM | POA: Diagnosis not present

## 2023-09-06 DIAGNOSIS — Z713 Dietary counseling and surveillance: Secondary | ICD-10-CM | POA: Insufficient documentation

## 2023-09-06 DIAGNOSIS — N1831 Chronic kidney disease, stage 3a: Secondary | ICD-10-CM | POA: Insufficient documentation

## 2023-09-06 DIAGNOSIS — E10319 Type 1 diabetes mellitus with unspecified diabetic retinopathy without macular edema: Secondary | ICD-10-CM

## 2023-09-06 DIAGNOSIS — E113599 Type 2 diabetes mellitus with proliferative diabetic retinopathy without macular edema, unspecified eye: Secondary | ICD-10-CM | POA: Insufficient documentation

## 2023-09-06 DIAGNOSIS — N1832 Chronic kidney disease, stage 3b: Secondary | ICD-10-CM

## 2023-09-06 LAB — POCT GLYCOSYLATED HEMOGLOBIN (HGB A1C): Hemoglobin A1C: 9.5 % — AB (ref 4.0–5.6)

## 2023-09-06 MED ORDER — INSULIN PEN NEEDLE 32G X 4 MM MISC: 1.0000 | Freq: Four times a day (QID) | 3 refills | Status: AC

## 2023-09-06 MED ORDER — TOUJEO SOLOSTAR 300 UNIT/ML ~~LOC~~ SOPN: PEN_INJECTOR | SUBCUTANEOUS | 2 refills | Status: AC

## 2023-09-06 NOTE — Patient Instructions (Addendum)
Take Toujeo 30 units at bedtime Take NovoLog 6 units before each meal NovoLog correctional insulin: ADD extra units on insulin to your meal-time NovoLog dose if your blood sugars are higher than 190. Use the scale below to help guide you:   Blood sugar before meal Number of units to inject  Less than 190 0 unit  191 -250 1 units  251- 310 2 units  311 - 370 3 units  371 - 430 4 units  431 - 490 5 units      HOW TO TREAT LOW BLOOD SUGARS (Blood sugar LESS THAN 70 MG/DL) Please follow the RULE OF 15 for the treatment of hypoglycemia treatment (when your (blood sugars are less than 70 mg/dL)   STEP 1: Take 15 grams of carbohydrates when your blood sugar is low, which includes:  3-4 GLUCOSE TABS  OR 3-4 OZ OF JUICE OR REGULAR SODA OR ONE TUBE OF GLUCOSE GEL    STEP 2: RECHECK blood sugar in 15 MINUTES STEP 3: If your blood sugar is still low at the 15 minute recheck --> then, go back to STEP 1 and treat AGAIN with another 15 grams of carbohydrates.

## 2023-09-06 NOTE — Patient Instructions (Addendum)
Take the Toujeo daily When taking the Novolog, give a meal dose plus a correction dose.  This should be given before you eat.  Treat any low blood glucose glucose with 1/2 cup juice or Regular soda, NOT diet.  Choose low potassium fruits and vegetables. Avoid added salt and all potassium containing salt substitutes Small portions of meat and choose plant based proteins at times. Avoid red meat Avoid dark soda and foods with Phos.... in the ingredient list.

## 2023-09-06 NOTE — Progress Notes (Signed)
Name: Antonio Martin  MRN/ DOB: [id number], [birth year]   Age/ Sex: 32 y.o., male    PCP: [facility]   Reason for Endocrinology Evaluation: Type 1 Diabetes Mellitus     Date of Initial Endocrinology Visit: 09/06/2023     PATIENT IDENTIFIER: Antonio Martin is a 32 y.o. male with a past medical history of HTN, DM. The patient presented for initial endocrinology clinic visit on 09/06/2023 for consultative assistance with his diabetes management.    HPI: [name] was    Diagnosed with DM age 79 Prior Medications tried/Intolerance: was on insulin at some point in the past  Currently checking blood sugars multiple x / day Hypoglycemia episodes : yes   frequency: daily                Hemoglobin A1c has ranged from 7.0% in 2016, peaking at 8.3% in 2022.   In terms of diet, the patient eats 3 meals a day, snacks occasionally. Avoids sugar sweetened beverages   Denies nausea or vomiting  Denies constipation or diarrhea   Holds Novolog with BG < 80 mg/dL   He follows with Nephrology , per patient he was told to avoid tomatoes, patient states he is not sure what he could eat because he typically eats a tomato rich diet  HOME DIABETES REGIMEN: Toujeo 35 units QHS Novolog 4-8 units TID   Statin: no ACE-I/ARB: no   CONTINUOUS GLUCOSE MONITORING RECORD INTERPRETATION    Dates of Recording: 10/30-11/09/2023  Sensor description: Freestyle libre 3  Results statistics:   CGM use % of time 95  Average and SD 244/43.6  Time in range    24    %  % Time Above 180 23  % Time above 250 47  % Time Below target 4   Glycemic patterns summary: Hyperglycemia noted throughout the day and night  Hyperglycemic episodes postprandial  Hypoglycemic episodes occurred without a pattern throughout the day and night  Overnight periods: Variable   DIABETIC COMPLICATIONS: Microvascular complications:  CKD III, right DR Denies:  Last eye exam: Completed  06/2023  Macrovascular complications:   Denies: CAD, PVD, CVA   PAST HISTORY: Past Medical History:  Past Medical History:  Diagnosis Date   Diabetes mellitus without complication (HCC)    Hypertension    new last 2 wks   Past Surgical History:  Past Surgical History:  Procedure Laterality Date   EYE SURGERY     FINGER SURGERY      Social History:  reports that he has never smoked. He has never used smokeless tobacco. He reports current alcohol use. He reports that he does not use drugs. Family History: No family history on file.   HOME MEDICATIONS: Allergies as of 09/06/2023       Reactions   Tape Rash        Medication List        Accurate as of September 06, 2023  9:47 AM. If you have any questions, ask your nurse or doctor.          acetaminophen 500 MG tablet Commonly known as: TYLENOL Take 1 tablet (500 mg total) by mouth every 6 (six) hours as needed.   bacitracin-polymyxin b ophthalmic ointment Commonly known as: POLYSPORIN Apply to eye.   butalbital-acetaminophen-caffeine 50-325-40 MG tablet Commonly known as: FIORICET TAKE 1 TABLET BY MOUTH EVERY 6 HOURS AS NEEDED FOR HEADACHE   cetirizine 10 MG tablet Commonly known as: ZYRTEC Take 1 tablet by mouth daily  as needed.   cyclobenzaprine 10 MG tablet Commonly known as: FLEXERIL Take 1 tablet (10 mg total) by mouth 2 (two) times daily as needed for muscle spasms.   FreeStyle Libre 3 Sensor Misc APPLY 1 SENSOR AND USE FOR GLUCOSE TESTING THREE TIMES DAILY AND AS NEEDED FOLLOW PACKAGE DIRECTIONS   Glucagon Emergency 1 MG Kit Inject 1 mg into the skin as directed.   ibuprofen 600 MG tablet Commonly known as: ADVIL Take 1 tablet (600 mg total) by mouth every 6 (six) hours as needed.   influenza vac split quadrivalent PF 0.5 ML injection Commonly known as: FLUARIX ADM 0.5ML IM UTD   Insulin Pen Needle 32G X 4 MM Misc 1 Device by Does not apply route in the morning, at noon, in the evening,  and at bedtime. Started by: Johnney Ou Ferlin [place]   insulin pump Soln Inject 1 each into the skin continuous. Uses Novolog  70 units total daily   lidocaine 5 % Commonly known as: Lidoderm Place 1 patch onto the skin daily. Remove & Discard patch within 12 hours or as directed by MD   lisinopril 10 MG tablet Commonly known as: ZESTRIL Take 10 mg by mouth daily.   loratadine 10 MG tablet Commonly known as: CLARITIN Take by mouth.   naproxen 500 MG tablet Commonly known as: NAPROSYN Take 1 tablet (500 mg total) by mouth 2 (two) times daily as needed for moderate pain.   NovoLOG 100 UNIT/ML injection Generic drug: insulin aspart FOR USE IN INSULIN PUMP WITH TOTAL DAILY DOSE OF 70 UNITS   Insulin Aspart FlexPen 100 UNIT/ML Commonly known as: NOVOLOG SMARTSIG:2-10 Unit(s) SUB-Q As Directed   NovoLOG 100 UNIT/ML injection Generic drug: insulin aspart 70 Units daily. Use in insulin pump   ondansetron 4 MG tablet Commonly known as: ZOFRAN Take 1 tablet (4 mg total) by mouth every 6 (six) hours.   PARoxetine 10 MG tablet Commonly known as: PAXIL TK 1 T PO QD   PARoxetine 20 MG tablet Commonly known as: PAXIL TK 1 T PO QD   Pennsaid 2 % Soln Generic drug: diclofenac Sodium APPLY 2 PUMPS (40 MG) TO THE AFFECTED AREA BY TOPICAL ROUTE 2 TIMES PER DAY   predniSONE 10 MG tablet Commonly known as: DELTASONE Takes 3 tablets for 1 days, then 2 tabs for 1 days, then 1 tab for 1 days, and then stop.   tetracaine 0.5 % ophthalmic solution Commonly known as: PONTOCAINE   Toujeo SoloStar 300 UNIT/ML Solostar Pen Generic drug: insulin glargine (1 Unit Dial) Max daily 45 units What changed: See the new instructions. Changed by: Johnney Ou Alyus Mofield   Ventolin HFA 108 (90 Base) MCG/ACT inhaler Generic drug: albuterol Inhale into the lungs.   Vitamin D (Ergocalciferol) 1.25 MG (50000 UNIT) Caps capsule Commonly known as: DRISDOL Take 50,000 Units by mouth once a week.          ALLERGIES: Allergies  Allergen Reactions   Tape Rash     REVIEW OF SYSTEMS: A comprehensive ROS was conducted with the patient and is negative except as per HPI     OBJECTIVE:   VITAL SIGNS: BP 136/84 (BP Location: Left Arm, Patient Position: Sitting, Cuff Size: Small)   Pulse 85   Ht 5\' 3"  (1.6 m)   Wt 136 lb (61.7 kg)   SpO2 99%   BMI 24.09 kg/m    PHYSICAL EXAM:  General: Pt appears well and is in NAD  Neck: General: Supple without adenopathy or  carotid bruits. Thyroid: Thyroid size normal.  No goiter or nodules appreciated.   Lungs: Clear with good BS bilat   Heart: RRR   Extremities:  Lower extremities - No pretibial edema.   Neuro: MS is good with appropriate affect, pt is alert and Ox3    DM foot exam: 09/06/2023  The skin of the feet is intact without sores or ulcerations. The pedal pulses are 2+ on right and 2+ on left. The sensation is intact to a screening 5.07, 10 gram monofilament bilaterally   DATA REVIEWED:  Lab Results  Component Value Date   HGBA1C 9.5 (A) 09/06/2023   HGBA1C 8.3 (H) 12/11/2020   HGBA1C 7.10 12/01/2015    Latest Reference Range & Units 11/25/22 00:00  Sodium 135 - 146 mmol/L 135  Potassium 3.5 - 5.3 mmol/L 5.5 (H)  Chloride 98 - 110 mmol/L 104  CO2 20 - 32 mmol/L 17 (L)  Glucose 65 - 99 mg/dL 409 (H)  BUN 7 - 25 mg/dL 36 (H)  Creatinine 8.11 - 1.26 mg/dL 9.14 (H)  Calcium 8.6 - 10.3 mg/dL 9.3  BUN/Creatinine Ratio 6 - 22 (calc) 18  eGFR > OR = 60 mL/min/1.14m2 44 (L)  AG Ratio 1.0 - 2.5 (calc) 1.6  AST 10 - 40 U/L 13  ALT 9 - 46 U/L 11  Total Protein 6.1 - 8.1 g/dL 7.6  Total Bilirubin 0.2 - 1.2 mg/dL 0.5  Total CHOL/HDL Ratio <5.0 (calc) 2.6  Cholesterol <200 mg/dL 782  HDL Cholesterol > OR = 40 mg/dL 56  LDL Cholesterol (Calc) mg/dL (calc) 72  Non-HDL Cholesterol (Calc) <130 mg/dL (calc) 89  Triglycerides <150 mg/dL 87      ASSESSMENT / PLAN / RECOMMENDATIONS:   1) Type 1 Diabetes Mellitus, Poorly  take Toujeo controlled, With CKD III and retinopathic  complications - Most recent A1c of 9.5 %. Goal A1c < 7.0 %.     GENERAL: I have discussed with the patient the pathophysiology of diabetes. We went over the natural progression of the disease. We stressed the importance of lifestyle changes. I explained the complications associated with diabetes including retinopathy, nephropathy, neuropathy as well as increased risk of cardiovascular disease. We went over the benefit seen with glycemic control.  I explained to the patient that diabetic patients are at higher than normal risk for amputations.  Patient has been noted with glycemic excursions 50-300 Mg/DL, this is multifactorial including insulin-carbohydrate mismatch, as well as guesstimating on insulin intake, including holding insulin inappropriately.  For example, last night he took NovoLog after dinner, which resulted in hypoglycemia, patient held his Toujeo and this morning his fasting BG's are high We discussed the importance of taking Toujeo daily Patient will be given a standing dose of prandial insulin to be taken BEFORE the meal He will also be given another correction scale A referral to our CDE will be placed, as the patient is at loss where he should and should not eat in the setting of DM and CKD Patient is NOT in insulin pump technology due to his type of work.  MEDICATIONS: Decrease Toujeo 30 units daily Take NovoLog 6 units 3 times daily before every meal Take CF: NovoLog(BG-130/60) TIDQAC  EDUCATION / INSTRUCTIONS: BG monitoring instructions: Patient is instructed to check his blood sugars 3 times a day. Call [facility] if: BG persistently < 70  I reviewed the Rule of 15 for the treatment of hypoglycemia in detail with the patient. Literature supplied.   2) Diabetic  complications:  Eye: Does  have known diabetic retinopathy.  Neuro/ Feet: Does not have known diabetic peripheral neuropathy. Renal:  Patient does  have known baseline CKD. He is  on an ACEI/ARB at present.    Follow-up in 4 weeks  I spent 45 minutes preparing to see the patient by review of recent labs, imaging and procedures, obtaining and reviewing separately obtained history, communicating with the patient,ordering medications, tests or procedures, and documenting clinical information in the EHR including the differential Dx, treatment, and any further evaluation and other management   Signed electronically by: Lyndle Herrlich, MD  [facility]  [facility] [address] [place] [place], [place] [address] Phone: [phone] FAX: [phone]   CC: [facility] [address] [place] [address] Phone: [phone]  Fax: [phone]    Return to Endocrinology clinic as below: Future Appointments  Date Time Provider Department Center  09/06/2023  2:00 PM Bonnita Levan, RD [facility]  11/16/2023  8:50 AM Keegen Heffern, Konrad Dolores, MD [facility] None

## 2023-09-06 NOTE — Progress Notes (Signed)
Diabetes Self-Management Education  Visit Type: First/Initial  Appt. Start Time: 1410 Appt. End Time: 1510  09/06/2023  [name], identified by name and date of birth, is a 32 y.o. male with a diagnosis of Diabetes: Type 1.   ASSESSMENT Patient is here today alone. He is taking taking his Novolog after a meal and then having lows.  He will also get lows occasionally after work and did not take his Toujeo because of this.  Instructed him to always take the Toujeo. Instructed him to take the Novolog before a meal but may need to hold the dose for hypoglycemia. He is treating hypoglycemia with diet Pepsi.  Discussed that he needs to treat with regular soda or juice. His potassium remains elevated per patient - nephrology visit this week.  Referral:  Type 1 Diabetes with stage 3 CKD Patient states that he saw his nephrologist yesterday and his potassium remained high.  History includes Type 1 diabetes (age 74), HTN Labs:  A1C 9.5% 09/06/2023 increased from 8.3% 12/11/2020, BUN 36, Creatinine 2.01, Potassium 5.5, eGFR 44 11/25/2022, vitamin D 16 on 11/25/2022 Medications include:  Toujeo 35 units q HS, Novolog 6 units tid before meals (Correction factor BG 130/60) CGM:   FreeStyle Libre  CGM Results from download:   % Time CGM active:   95 %   (Goal >70%)  Average glucose:   244 mg/dL for 14 days  Glucose management indicator:    %  Time in range (70-180 mg/dL):   24 %   (Goal >40%)  Time High (181-250 mg/dL):   23 %   (Goal < 98%)  Time Very High (>250 mg/dL):    47 %   (Goal < 5%)  Time Low (54-69 mg/dL):   4 %   (Goal <1%)  Time Very Low (<54 mg/dL):   0 %   (Goal <1%)  %CV (glucose variability)    43.6 %  (Goal <36%)   Weight hx: 63" 135 lbs 09/06/2023  Patient lives with his brother.  Patient does the shopping and cooking.  Patient works for a [employer].  Increased amounts of walking (second shift). He does not eat out. Patient is from an  [place].  He has been in the [place] 8 years.  Poor medical care in his country. Weight 135 lb (61.2 kg). Body mass index is 23.91 kg/m.   Diabetes Self-Management Education - 09/06/23 1443       Visit Information   Visit Type First/Initial      Initial Visit   Diabetes Type Type 1    Date Diagnosed 1998    Are you currently following a meal plan? No    Are you taking your medications as prescribed? No      Health Coping   How would you rate your overall health? Good      Psychosocial Assessment   Patient Belief/Attitude about Diabetes Afraid    What is the hardest part about your diabetes right now, causing you the most concern, or is the most worrisome to you about your diabetes?   Making healty food and beverage choices    Self-care barriers English as a second language    Self-management support Doctor's office;Family    Other persons present Patient;Family Member    Patient Concerns Nutrition/Meal planning    Special Needs None    Preferred Learning Style Visual    Learning Readiness Ready    How often do you need  to have someone help you when you read instructions, pamphlets, or other written materials from your doctor or pharmacy? 3 - Sometimes    What is the last grade level you completed in school? 10      Pre-Education Assessment   Patient understands the diabetes disease and treatment process. Needs Instruction    Patient understands incorporating nutritional management into lifestyle. Needs Instruction    Patient undertands incorporating physical activity into lifestyle. Needs Instruction    Patient understands using medications safely. Needs Instruction    Patient understands monitoring blood glucose, interpreting and using results Needs Instruction    Patient understands prevention, detection, and treatment of acute complications. Needs Instruction    Patient understands prevention, detection, and treatment of chronic complications. Needs Instruction     Patient understands how to develop strategies to address psychosocial issues. Needs Instruction    Patient understands how to develop strategies to promote health/change behavior. Needs Instruction      Complications   Last HgB A1C per patient/outside source 9.5 %   09/06/2023 increased from 8.3% 12/11/2022   How often do you check your blood sugar? > 4 times/day    Fasting Blood glucose range (mg/dL) >161    Have you had a dilated eye exam in the past 12 months? Yes    Have you had a dental exam in the past 12 months? Yes    Are you checking your feet? Yes    How many days per week are you checking your feet? 4      Dietary Intake   Breakfast egg with onion    Snack (morning) none    Lunch wings    Snack (afternoon) occasional    Dinner salad with tuna, beans, barley    Snack (evening) none    Beverage(s) water, occasional diet cokes, black coffee      Activity / Exercise   Activity / Exercise Type Light (walking / raking leaves)   soccor   How many days per week do you exercise? 1    How many minutes per day do you exercise? 60    Total minutes per week of exercise 60      Patient Education   Previous Diabetes Education No    Disease Pathophysiology Definition of diabetes, type 1 and 2, and the diagnosis of diabetes    Healthy Eating Meal options for control of blood glucose level and chronic complications.;Role of diet in the treatment of diabetes and the relationship between the three main macronutrients and blood glucose level;Other (comment)   renal, low potassium   Being Active Role of exercise on diabetes management, blood pressure control and cardiac health.    Medications Reviewed patients medication for diabetes, action, purpose, timing of dose and side effects.    Monitoring Taught/evaluated CGM (comment);Identified appropriate SMBG and/or A1C goals.;Yearly dilated eye exam;Daily foot exams    Acute complications Taught prevention, symptoms, and  treatment of  hypoglycemia - the 15 rule.;Discussed and identified patients' prevention, symptoms, and treatment of hyperglycemia.    Chronic complications Identified and discussed with patient  current chronic complications;Relationship between chronic complications and blood glucose control    Diabetes Stress and Support Identified and addressed patients feelings and concerns about diabetes;Worked with patient to identify barriers to care and solutions      Individualized Goals (developed by patient)   Nutrition General guidelines for healthy choices and portions discussed;Carb counting    Physical Activity Exercise 5-7 days per week  Medications take my medication as prescribed    Problem Solving Addressing barriers to behavior change      Post-Education Assessment   Patient understands incorporating nutritional management into lifestyle. Comprehends key points    Patient undertands incorporating physical activity into lifestyle. Comprehends key points    Patient understands using medications safely. Needs Review    Patient understands monitoring blood glucose, interpreting and using results Needs Review    Patient understands prevention, detection, and treatment of acute complications. Needs Review    Patient understands prevention, detection, and treatment of chronic complications. Comprehends key points    Patient understands how to develop strategies to address psychosocial issues. Comprehends key points    Patient understands how to develop strategies to promote health/change behavior. Needs Review      Outcomes   Expected Outcomes Demonstrated interest in learning. Expect positive outcomes    Future DMSE 2 months    Program Status Not Completed             Individualized Plan for Diabetes Self-Management Training:   Learning Objective:  Patient will have a greater understanding of diabetes self-management. Patient education plan is to attend individual and/or group sessions per  assessed needs and concerns.   Plan:   Patient Instructions  Take the Toujeo daily When taking the Novolog, give a meal dose plus a correction dose.  This should be given before you eat.  Treat any low blood glucose glucose with 1/2 cup juice or Regular soda, NOT diet.  Choose low potassium fruits and vegetables. Avoid added salt and all potassium containing salt substitutes Small portions of meat and choose plant based proteins at times. Avoid red meat Avoid dark soda and foods with Phos.... in the ingredient list.  Expected Outcomes:  Demonstrated interest in learning. Expect positive outcomes  Education material provided: ADA - How to Thrive: A Guide for Your Journey with Diabetes and Diabetes Resources, NKD national kidney diet - Dish up a Kidney-Friendly Meal for Patients with Chronic Kidney Disease (not on dialysis)  If problems or questions, patient to contact team via:  Phone  Future DSME appointment: 2 months

## 2023-09-07 ENCOUNTER — Encounter: Payer: Self-pay | Admitting: Internal Medicine

## 2023-10-13 ENCOUNTER — Ambulatory Visit: Payer: Medicaid Other | Admitting: Dietician

## 2023-11-16 ENCOUNTER — Ambulatory Visit: Payer: Medicaid Other | Admitting: Internal Medicine

## 2023-11-16 NOTE — Progress Notes (Deleted)
Name: Antonio Martin  MRN/ DOB: [id number], Mar 04, 1991   Age/ Sex: 33 y.o., male    PCP: [facility]   Reason for Endocrinology Evaluation: Type 1 Diabetes Mellitus     Date of Initial Endocrinology Visit: 09/06/2023    PATIENT IDENTIFIER: [name] is a 33 y.o. male with a past medical history of HTN, DM. The patient presented for initial endocrinology clinic visit on 09/06/2023 for consultative assistance with his diabetes management.    HPI: Mr. Stills was    Diagnosed with DM age 27 Prior Medications tried/Intolerance: was on insulin at some point in the past             Hemoglobin A1c has ranged from 7.0% in 2016, peaking at 8.3% in 2022.  He follows with Nephrology , per patient he was told to avoid tomatoes, patient states he is not sure what he could eat because he typically eats a tomato rich diet   On his initial visit to our clinic he had an A1c of 9.5%, he was not interested in insulin pump technology at the time due to his job requirements. I adjusted his insulin regimen and provided him with a correction scale  He met with our dietitian 08/2023     SUBJECTIVE:   During the last visit (09/06/2023): A1c 9.5%  Today (11/16/23): [name] is here for follow-up on diabetes management.  He checks his blood sugars multiple  times daily through CGM. The patient has *** had hypoglycemic episodes since the last clinic visit. The patient is *** symptomatic with these episodes.      HOME DIABETES REGIMEN: Toujeo 30 units QHS Novolog 6 units TID CF: NovoLog (BG -130/60) TIDQAC   Statin: no ACE-I/ARB: no   CONTINUOUS GLUCOSE MONITORING RECORD INTERPRETATION    Dates of Recording: 10/30-11/09/2023  Sensor description: Freestyle libre 3  Results statistics:   CGM use % of time 95  Average and SD 244/43.6  Time in range    24    %  % Time Above 180 23  % Time above 250 47  % Time Below target 4   Glycemic patterns summary:  Hyperglycemia noted throughout the day and night  Hyperglycemic episodes postprandial  Hypoglycemic episodes occurred without a pattern throughout the day and night  Overnight periods: Variable   DIABETIC COMPLICATIONS: Microvascular complications:  CKD III, 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 He has never used smokeless tobacco. He reports current alcohol use. He reports that he does not use drugs. Family History: No family history on file.   HOME MEDICATIONS: Allergies as of 11/16/2023       Reactions   Tape Rash        Medication List        Accurate as of November 16, 2023  7:05 AM. If you have any questions, ask your nurse or doctor.          acetaminophen 500 MG tablet Commonly known as: TYLENOL Take 1 tablet (  500 mg total) by mouth every 6 (six) hours as needed.   bacitracin-polymyxin b ophthalmic ointment Commonly known as: POLYSPORIN Apply to eye.   butalbital-acetaminophen-caffeine 50-325-40 MG tablet Commonly known as: FIORICET TAKE 1 TABLET BY MOUTH EVERY 6 HOURS AS NEEDED FOR HEADACHE   cetirizine 10 MG tablet Commonly known as: ZYRTEC Take 1 tablet by mouth daily as needed.   cyclobenzaprine 10 MG tablet Commonly known as: FLEXERIL Take 1 tablet (10 mg total) by mouth 2 (two) times daily as needed for muscle spasms.   FreeStyle Libre 3 Sensor Misc APPLY 1 SENSOR AND USE FOR GLUCOSE TESTING THREE TIMES DAILY AND AS NEEDED FOLLOW PACKAGE DIRECTIONS   Glucagon Emergency 1 MG Kit Inject 1 mg into the skin as  directed.   ibuprofen 600 MG tablet Commonly known as: ADVIL Take 1 tablet (600 mg total) by mouth every 6 (six) hours as needed.   influenza vac split quadrivalent PF 0.5 ML injection Commonly known as: FLUARIX ADM 0.5ML IM UTD   Insulin Pen Needle 32G X 4 MM Misc 1 Device by Does not apply route in the morning, at noon, in the evening, and at bedtime.   insulin pump Soln Inject 1 each into the skin continuous. Uses Novolog  70 units total daily   lidocaine 5 % Commonly known as: Lidoderm Place 1 patch onto the skin daily. Remove & Discard patch within 12 hours or as directed by MD   lisinopril 10 MG tablet Commonly known as: ZESTRIL Take 10 mg by mouth daily.   loratadine 10 MG tablet Commonly known as: CLARITIN Take by mouth.   naproxen 500 MG tablet Commonly known as: NAPROSYN Take 1 tablet (500 mg total) by mouth 2 (two) times daily as needed for moderate pain.   NovoLOG 100 UNIT/ML injection Generic drug: insulin aspart   Insulin Aspart FlexPen 100 UNIT/ML Commonly known as: NOVOLOG SMARTSIG:2-10 Unit(s) SUB-Q As Directed   NovoLOG 100 UNIT/ML injection Generic drug: insulin aspart 70 Units daily. Use in insulin pump   ondansetron 4 MG tablet Commonly known as: ZOFRAN Take 1 tablet (4 mg total) by mouth every 6 (six) hours.   PARoxetine 10 MG tablet Commonly known as: PAXIL TK 1 T PO QD   PARoxetine 20 MG tablet Commonly known as: PAXIL TK 1 T PO QD   Pennsaid 2 % Soln Generic drug: diclofenac Sodium APPLY 2 PUMPS (40 MG) TO THE AFFECTED AREA BY TOPICAL ROUTE 2 TIMES PER DAY   predniSONE 10 MG tablet Commonly known as: DELTASONE Takes 3 tablets for 1 days, then 2 tabs for 1 days, then 1 tab for 1 days, and then stop.   tetracaine 0.5 % ophthalmic solution Commonly known as: PONTOCAINE   Toujeo SoloStar 300 UNIT/ML Solostar Pen Generic drug: insulin glargine (1 Unit Dial) Max daily 45 units   Ventolin HFA 108 (90 Base) MCG/ACT  inhaler Generic drug: albuterol Inhale into the lungs.   Vitamin D (Ergocalciferol) 1.25 MG (50000 UNIT) Caps capsule Commonly known as: DRISDOL Take 50,000 Units by mouth once a week.         ALLERGIES: Allergies  Allergen Reactions   Tape Rash     REVIEW OF SYSTEMS: A comprehensive ROS was conducted with the patient and is negative except as per HPI     OBJECTIVE:   VITAL SIGNS: There were no vitals taken for this visit.   PHYSICAL EXAM:  General: Pt appears well and is in NAD  Neck: General: Supple without  adenopathy or carotid bruits. Thyroid: Thyroid size normal.  No goiter or nodules appreciated.   Lungs: Clear with good BS bilat   Heart: RRR   Extremities:  Lower extremities - No pretibial edema.   Neuro: MS is good with appropriate affect, pt is alert and Ox3    DM foot exam: 09/06/2023  The skin of the feet is intact without sores or ulcerations. The pedal pulses are 2+ on right and 2+ on left. The sensation is intact to a screening 5.07, 10 gram monofilament bilaterally   DATA REVIEWED:  Lab Results  Component Value Date   HGBA1C 9.5 (A) 09/06/2023   HGBA1C 8.3 (H) 12/11/2020   HGBA1C 7.10 12/01/2015    Latest Reference Range & Units 11/25/22 00:00  Sodium 135 - 146 mmol/L 135  Potassium 3.5 - 5.3 mmol/L 5.5 (H)  Chloride 98 - 110 mmol/L 104  CO2 20 - 32 mmol/L 17 (L)  Glucose 65 - 99 mg/dL 130 (H)  BUN 7 - 25 mg/dL 36 (H)  Creatinine 8.65 - 1.26 mg/dL 7.84 (H)  Calcium 8.6 - 10.3 mg/dL 9.3  BUN/Creatinine Ratio 6 - 22 (calc) 18  eGFR > OR = 60 mL/min/1.64m2 44 (L)  AG Ratio 1.0 - 2.5 (calc) 1.6  AST 10 - 40 U/L 13  ALT 9 - 46 U/L 11  Total Protein 6.1 - 8.1 g/dL 7.6  Total Bilirubin 0.2 - 1.2 mg/dL 0.5  Total CHOL/HDL Ratio <5.0 (calc) 2.6  Cholesterol <200 mg/dL 696  HDL Cholesterol > OR = 40 mg/dL 56  LDL Cholesterol (Calc) mg/dL (calc) 72  Non-HDL Cholesterol (Calc) <130 mg/dL (calc) 89  Triglycerides <150 mg/dL 87       ASSESSMENT / PLAN / RECOMMENDATIONS:   1) Type 1 Diabetes Mellitus, Poorly take Toujeo controlled, With CKD III and retinopathic  complications - Most recent A1c of 9.5 %. Goal A1c < 7.0 %.     Patient is NOT in insulin pump technology due to his type of work.  MEDICATIONS: Decrease Toujeo 30 units daily Take NovoLog 6 units 3 times daily before every meal Take CF: NovoLog(BG-130/60) TIDQAC  EDUCATION / INSTRUCTIONS: BG monitoring instructions: Patient is instructed to check his blood sugars 3 times a day. Call [facility] if: BG persistently < 70  I reviewed the Rule of 15 for the treatment of hypoglycemia in detail with the patient. Literature supplied.   2) Diabetic complications:  Eye: Does  have known diabetic retinopathy.  Neuro/ Feet: Does not have known diabetic peripheral neuropathy. Renal: Patient does  have known baseline CKD. He is  on an ACEI/ARB at present.    Follow-up in     Signed electronically by: Lyndle Herrlich, MD  [facility] End[facility]  [facility] [address] [place] [place], [place] [address] Phone: [phone] FAX: [phone]   CC: [facility] [address] [place] [address] Phone: [phone]  Fax: [phone]    Return to Endocrinology clinic as below: Future Appointments  Date Time Provider Department Center  11/16/2023  8:50 AM Halie Gass, Konrad Dolores, MD [id number] None

## 2023-11-17 ENCOUNTER — Encounter: Payer: Self-pay | Admitting: Internal Medicine

## 2023-11-17 ENCOUNTER — Ambulatory Visit (INDEPENDENT_AMBULATORY_CARE_PROVIDER_SITE_OTHER): Payer: Medicaid Other | Admitting: Internal Medicine

## 2023-11-17 VITALS — BP 122/76 | HR 84 | Ht 63.0 in | Wt 134.0 lb

## 2023-11-17 DIAGNOSIS — N1831 Chronic kidney disease, stage 3a: Secondary | ICD-10-CM

## 2023-11-17 DIAGNOSIS — E1022 Type 1 diabetes mellitus with diabetic chronic kidney disease: Secondary | ICD-10-CM | POA: Diagnosis not present

## 2023-11-17 DIAGNOSIS — E1065 Type 1 diabetes mellitus with hyperglycemia: Secondary | ICD-10-CM | POA: Diagnosis not present

## 2023-11-17 DIAGNOSIS — E10319 Type 1 diabetes mellitus with unspecified diabetic retinopathy without macular edema: Secondary | ICD-10-CM | POA: Diagnosis not present

## 2023-11-17 LAB — POCT GLYCOSYLATED HEMOGLOBIN (HGB A1C): Hemoglobin A1C: 9.2 % — AB (ref 4.0–5.6)

## 2023-11-17 NOTE — Progress Notes (Signed)
Name: Antonio Martin  MRN/ DOB: [id number], 1991-10-03   Age/ Sex: 33 y.o., male    PCP: [facility]   Reason for Endocrinology Evaluation: Type 1 Diabetes Mellitus     Date of Initial Endocrinology Visit: 09/06/2023    PATIENT IDENTIFIER: [name] is a 33 y.o. male with a past medical history of HTN, DM. The patient presented for initial endocrinology clinic visit on 09/06/2023 for consultative assistance with his diabetes management.    HPI: [name] was    Diagnosed with DM age 32 Prior Medications tried/Intolerance: was on insulin at some point in the past             Hemoglobin A1c has ranged from 7.0% in 2016, peaking at 8.3% in 2022.  He follows with Nephrology , per patient he was told to avoid tomatoes, patient states he is not sure what he could eat because he typically eats a tomato rich diet   On his initial visit to our clinic he had an A1c of 9.5%, he was not interested in insulin pump technology at the time due to his job requirements. I adjusted his insulin regimen and provided him with a correction scale  He met with our dietitian 08/2023     SUBJECTIVE:   During the last visit (09/06/2023): A1c 9.5%  Today (11/17/23): Antonio Martin is here for follow-up on diabetes management.  He checks his blood sugars multiple  times daily through CGM. The patient has had had hypoglycemic episodes since the last clinic visit.   Denies nausea or vomiting  Denies constipation or diarrhea    HOME DIABETES REGIMEN: Toujeo 30 units at bedtime- 15 units QAM and 25 units QPM Novolog 6 units TID- 8 units  CF: NovoLog (BG -130/60) TIDQAC   Statin: no ACE-I/ARB: no   CONTINUOUS GLUCOSE MONITORING RECORD INTERPRETATION    Dates of Recording: 1/10-1/23/2025  Sensor description: Freestyle libre 3  Results statistics:   CGM use % of time 96  Average and SD 240/37.6  Time in range  28  %  % Time Above 180 24  % Time above 250 46  % Time  Below target 4   Glycemic patterns summary: Hyperglycemia noted throughout the day and night  Hyperglycemic episodes postprandial   Hypoglycemic episodes occurred without a pattern throughout the day and night  Overnight periods: Variable but mostly high   DIABETIC COMPLICATIONS: Microvascular complications:  CKD III, 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 He has never used smokeless tobacco. He reports current alcohol use. He reports that he does not use drugs. Family History: No family history on file.   HOME MEDICATIONS: Allergies as of 11/17/2023       Reactions   Tape Rash        Medication List        Accurate as of November 17, 2023  8:20 AM. If you have any questions, ask your nurse or doctor.  acetaminophen 500 MG tablet Commonly known as: TYLENOL Take 1 tablet (500 mg total) by mouth every 6 (six) hours as needed.   bacitracin-polymyxin b ophthalmic ointment Commonly known as: POLYSPORIN Apply to eye.   butalbital-acetaminophen-caffeine 50-325-40 MG tablet Commonly known as: FIORICET TAKE 1 TABLET BY MOUTH EVERY 6 HOURS AS NEEDED FOR HEADACHE   cetirizine 10 MG tablet Commonly known as: ZYRTEC Take 1 tablet by mouth daily as needed.   cyclobenzaprine 10 MG tablet Commonly known as: FLEXERIL Take 1 tablet (10 mg total) by mouth 2 (two) times daily as needed for muscle spasms.   FreeStyle Libre 3 Sensor Misc APPLY 1 SENSOR AND USE FOR GLUCOSE TESTING THREE TIMES DAILY AND AS NEEDED FOLLOW PACKAGE DIRECTIONS    Glucagon Emergency 1 MG Kit Inject 1 mg into the skin as directed.   hydrOXYzine 25 MG tablet Commonly known as: ATARAX Take 25 mg by mouth 2 (two) times daily as needed.   ibuprofen 600 MG tablet Commonly known as: ADVIL Take 1 tablet (600 mg total) by mouth every 6 (six) hours as needed.   influenza vac split quadrivalent PF 0.5 ML injection Commonly known as: FLUARIX ADM 0.5ML IM UTD   Insulin Pen Needle 32G X 4 MM Misc 1 Device by Does not apply route in the morning, at noon, in the evening, and at bedtime.   insulin pump Soln Inject 1 each into the skin continuous. Uses Novolog  70 units total daily   lidocaine 5 % Commonly known as: Lidoderm Place 1 patch onto the skin daily. Remove & Discard patch within 12 hours or as directed by MD   lisinopril 10 MG tablet Commonly known as: ZESTRIL Take 10 mg by mouth daily.   loratadine 10 MG tablet Commonly known as: CLARITIN Take by mouth.   naproxen 500 MG tablet Commonly known as: NAPROSYN Take 1 tablet (500 mg total) by mouth 2 (two) times daily as needed for moderate pain.   NovoLOG 100 UNIT/ML injection Generic drug: insulin aspart   Insulin Aspart FlexPen 100 UNIT/ML Commonly known as: NOVOLOG SMARTSIG:2-10 Unit(s) SUB-Q As Directed   NovoLOG 100 UNIT/ML injection Generic drug: insulin aspart 70 Units daily. Use in insulin pump   ondansetron 4 MG tablet Commonly known as: ZOFRAN Take 1 tablet (4 mg total) by mouth every 6 (six) hours.   PARoxetine 10 MG tablet Commonly known as: PAXIL TK 1 T PO QD   PARoxetine 20 MG tablet Commonly known as: PAXIL TK 1 T PO QD   Pennsaid 2 % Soln Generic drug: diclofenac Sodium APPLY 2 PUMPS (40 MG) TO THE AFFECTED AREA BY TOPICAL ROUTE 2 TIMES PER DAY   predniSONE 10 MG tablet Commonly known as: DELTASONE Takes 3 tablets for 1 days, then 2 tabs for 1 days, then 1 tab for 1 days, and then stop.   tetracaine 0.5 % ophthalmic solution Commonly known as:  PONTOCAINE   Toujeo SoloStar 300 UNIT/ML Solostar Pen Generic drug: insulin glargine (1 Unit Dial) Max daily 45 units   Ventolin HFA 108 (90 Base) MCG/ACT inhaler Generic drug: albuterol Inhale into the lungs.   Vitamin D (Ergocalciferol) 1.25 MG (50000 UNIT) Caps capsule Commonly known as: DRISDOL Take 50,000 Units by mouth once a week.         ALLERGIES: Allergies  Allergen Reactions   Tape Rash     REVIEW OF SYSTEMS: A comprehensive ROS was conducted with the patient and is negative except as per HPI  OBJECTIVE:   VITAL SIGNS: BP 122/76 (BP Location: Left Arm, Patient Position: Sitting, Cuff Size: Normal)   Pulse 84   Ht 5\' 3"  (1.6 m)   Wt 134 lb (60.8 kg)   SpO2 96%   BMI 23.74 kg/m    PHYSICAL EXAM:  General: Pt appears well and is in NAD  Lungs: Clear with good BS bilat   Heart: RRR   Extremities:  Lower extremities - No pretibial edema.   Neuro: MS is good with appropriate affect, pt is alert and Ox3    DM foot exam: 09/06/2023  The skin of the feet is intact without sores or ulcerations. The pedal pulses are 2+ on right and 2+ on left. The sensation is intact to a screening 5.07, 10 gram monofilament bilaterally   DATA REVIEWED:  Lab Results  Component Value Date   HGBA1C 9.5 (A) 09/06/2023   HGBA1C 8.3 (H) 12/11/2020   HGBA1C 7.10 12/01/2015    Latest Reference Range & Units 11/25/22 00:00  Sodium 135 - 146 mmol/L 135  Potassium 3.5 - 5.3 mmol/L 5.5 (H)  Chloride 98 - 110 mmol/L 104  CO2 20 - 32 mmol/L 17 (L)  Glucose 65 - 99 mg/dL 621 (H)  BUN 7 - 25 mg/dL 36 (H)  Creatinine 3.08 - 1.26 mg/dL 6.57 (H)  Calcium 8.6 - 10.3 mg/dL 9.3  BUN/Creatinine Ratio 6 - 22 (calc) 18  eGFR > OR = 60 mL/min/1.73m2 44 (L)  AG Ratio 1.0 - 2.5 (calc) 1.6  AST 10 - 40 U/L 13  ALT 9 - 46 U/L 11  Total Protein 6.1 - 8.1 g/dL 7.6  Total Bilirubin 0.2 - 1.2 mg/dL 0.5  Total CHOL/HDL Ratio <5.0 (calc) 2.6  Cholesterol <200 mg/dL 846  HDL  Cholesterol > OR = 40 mg/dL 56  LDL Cholesterol (Calc) mg/dL (calc) 72  Non-HDL Cholesterol (Calc) <130 mg/dL (calc) 89  Triglycerides <150 mg/dL 87      ASSESSMENT / PLAN / RECOMMENDATIONS:   1) Type 1 Diabetes Mellitus, Poorly take Toujeo controlled, With CKD III and retinopathic  complications - Most recent A1c of 9.5 %. Goal A1c < 7.0 %.    -A1c continues to be above goal -The patient is on more basal insulin than previously prescribed, patient states his PCP asked him to change his basal insulin and divide the doses in half.  I did explain to the patient that he did have hypoglycemia overnight on CGM download, this is due to too much basal insulin, I also explained to the patient that based on pharmacokinetics of basal insulin there is no need to use and dividing Toujeo dose into twice daily dosing. -I have asked him again to decrease Toujeo as below -In reviewing freestyle [employer], the patient has been noted with postprandial hyperglycemia at certain times, but the patient denies eating at those times, I am not sure if the timing on his freestyle Josephine Igo is different or he is forgetting that he ate during these times -He has not been using the correction scale -He was encouraged again to use NovoLog each time he eats and use correction scale as needed  -Patient is NOT in insulin pump technology due to his type of work.  I have again encouraged him to consider an OmniPod 5 as the status quo has not improved his glycemic control at all -Patient is going to [place], and would like to wait, he was provided with the name of OmniPod 5 to look it up  in case he would like to do his own research but I do think this is the best option at this time  MEDICATIONS: Decrease Toujeo 32 units daily Take NovoLog 8 units 3 times daily before every meal Take CF: NovoLog(BG-130/60) TIDQAC  EDUCATION / INSTRUCTIONS: BG monitoring instructions: Patient is instructed to check his blood sugars 3 times a  day. Call [facility] if: BG persistently < 70  I reviewed the Rule of 15 for the treatment of hypoglycemia in detail with the patient. Literature supplied.   2) Diabetic complications:  Eye: Does  have known diabetic retinopathy.  Neuro/ Feet: Does not have known diabetic peripheral neuropathy. Renal: Patient does  have known baseline CKD. He is  on an ACEI/ARB at present.    Follow-up in 3 months    Signed electronically by: Lyndle Herrlich, MD  [facility] Endocrinology  [facility] [address] [place] [place], [place] [address] Phone: [phone] FAX: [phone]   CC: [facility] [address] [place] [address] Phone: [phone]  Fax: [phone]    Return to Endocrinology clinic as below: No future appointments.

## 2023-11-17 NOTE — Patient Instructions (Addendum)
Take Toujeo 32 units at bedtime Take NovoLog 8 units before each meal NovoLog correctional insulin: ADD extra units on insulin to your meal-time NovoLog dose if your blood sugars are higher than 190. Use the scale below to help guide you:   Blood sugar before meal Number of units to inject  Less than 190 0 unit  191 -250 1 units  251- 310 2 units  311 - 370 3 units  371 - 430 4 units  431 - 490 5 units      HOW TO TREAT LOW BLOOD SUGARS (Blood sugar LESS THAN 70 MG/DL) Please follow the RULE OF 15 for the treatment of hypoglycemia treatment (when your (blood sugars are less than 70 mg/dL)   STEP 1: Take 15 grams of carbohydrates when your blood sugar is low, which includes:  3-4 GLUCOSE TABS  OR 3-4 OZ OF JUICE OR REGULAR SODA OR ONE TUBE OF GLUCOSE GEL    STEP 2: RECHECK blood sugar in 15 MINUTES STEP 3: If your blood sugar is still low at the 15 minute recheck --> then, go back to STEP 1 and treat AGAIN with another 15 grams of carbohydrates.

## 2024-02-21 ENCOUNTER — Ambulatory Visit: Payer: Medicaid Other | Admitting: Internal Medicine

## 2024-02-21 NOTE — Progress Notes (Deleted)
 Name: Antonio Martin  MRN/ DOB: [id number], 02/24/1991   Age/ Sex: 33 y.o., male    PCP: [facility]   Reason for Endocrinology Evaluation: Type 1 Diabetes Mellitus     Date of Initial Endocrinology Visit: 09/06/2023    PATIENT IDENTIFIER: Antonio Martin is a 33 y.o. male with a past medical history of HTN, DM. The patient presented for initial endocrinology clinic visit on 09/06/2023 for consultative assistance with his diabetes management.    HPI: Antonio Martin was    Diagnosed with DM age 44 Prior Medications tried/Intolerance: was on insulin  at some point in the past             Hemoglobin A1c has ranged from 7.0% in 2016, peaking at 8.3% in 2022.  He follows with Nephrology , per patient he was told to avoid tomatoes, patient states he is not sure what he could eat because he typically eats a tomato rich diet   On his initial visit to our clinic he had an A1c of 9.5%, he was not interested in insulin  pump technology at the time due to his job requirements. I adjusted his insulin  regimen and provided him with a correction scale  He met with our dietitian 08/2023     SUBJECTIVE:   During the last visit (11/17/2023): A1c 9.5%  Today (02/21/24): Antonio Martin is here for follow-up on diabetes management.  He checks his blood sugars multiple  times daily through CGM. The patient has had had hypoglycemic episodes since the last clinic visit.    Patient follows with [facility] for retinal injections, last one 12/15/2023 Denies nausea or vomiting  Denies constipation or diarrhea    HOME DIABETES REGIMEN: Toujeo  32 units at bedtime Novolog  8 units TID CF: NovoLog  (BG -130/60) TIDQAC   Statin: no ACE-I/ARB: no   CONTINUOUS GLUCOSE MONITORING RECORD INTERPRETATION    Dates of Recording: 1/10-1/23/2025  Sensor description: Freestyle libre 3  Results statistics:   CGM use % of time 96  Average and SD 240/37.6  Time in range  28  %  %  Time Above 180 24  % Time above 250 46  % Time Below target 4   Glycemic patterns summary: Hyperglycemia noted throughout the day and night  Hyperglycemic episodes postprandial   Hypoglycemic episodes occurred without a pattern throughout the day and night  Overnight periods: Variable but mostly high   DIABETIC COMPLICATIONS: Microvascular complications:  CKD III, B/L  DR Denies:  Last eye exam: Completed 06/2023  Macrovascular complications:   Denies: CAD, PVD, CVA   PAST HISTORY: Past Medical History:  Past Medical History:  Diagnosis Date   Chronic kidney disease    Diabetes mellitus without complication (HCC)    Hypertension    new last 2 wks   Past Surgical History:  Past Surgical History:  Procedure Laterality Date   EYE SURGERY     FINGER SURGERY      Social History:  reports that he has never smoked. He has never used smokeless tobacco. He reports current alcohol use. He reports that he does not use drugs. Family History: No family history on file.   HOME MEDICATIONS: Allergies as of 02/21/2024       Reactions   Tape Rash        Medication List        Accurate as of February 21, 2024  7:08 AM. If you have any questions, ask your nurse or doctor.  acetaminophen  500 MG tablet Commonly known as: TYLENOL  Take 1 tablet (500 mg total) by mouth every 6 (six) hours as needed.   bacitracin-polymyxin b ophthalmic ointment Commonly known as: POLYSPORIN Apply to eye.   butalbital-acetaminophen -caffeine 50-325-40 MG tablet Commonly known as: FIORICET TAKE 1 TABLET BY MOUTH EVERY 6 HOURS AS NEEDED FOR HEADACHE   cetirizine 10 MG tablet Commonly known as: ZYRTEC Take 1 tablet by mouth daily as needed.   cyclobenzaprine  10 MG tablet Commonly known as: FLEXERIL  Take 1 tablet (10 mg total) by mouth 2 (two) times daily as needed for muscle spasms.   FreeStyle Libre 3 Sensor Misc APPLY 1 SENSOR AND USE FOR GLUCOSE TESTING THREE TIMES DAILY AND  AS NEEDED FOLLOW PACKAGE DIRECTIONS   Glucagon Emergency 1 MG Kit Inject 1 mg into the skin as directed.   hydrOXYzine 25 MG tablet Commonly known as: ATARAX Take 25 mg by mouth 2 (two) times daily as needed.   ibuprofen  600 MG tablet Commonly known as: ADVIL  Take 1 tablet (600 mg total) by mouth every 6 (six) hours as needed.   influenza vac split quadrivalent PF 0.5 ML injection Commonly known as: FLUARIX ADM 0.5ML IM UTD   Insulin  Pen Needle 32G X 4 MM Misc 1 Device by Does not apply route in the morning, at noon, in the evening, and at bedtime.   insulin  pump Soln Inject 1 each into the skin continuous. Uses Novolog   70 units total daily   lidocaine  5 % Commonly known as: Lidoderm  Place 1 patch onto the skin daily. Remove & Discard patch within 12 hours or as directed by MD   lisinopril  10 MG tablet Commonly known as: ZESTRIL  Take 10 mg by mouth daily.   loratadine 10 MG tablet Commonly known as: CLARITIN Take by mouth.   naproxen  500 MG tablet Commonly known as: NAPROSYN  Take 1 tablet (500 mg total) by mouth 2 (two) times daily as needed for moderate pain.   NovoLOG  100 UNIT/ML injection Generic drug: insulin  aspart   Insulin  Aspart FlexPen 100 UNIT/ML Commonly known as: NOVOLOG  SMARTSIG:2-10 Unit(s) SUB-Q As Directed   NovoLOG  100 UNIT/ML injection Generic drug: insulin  aspart 70 Units daily. Use in insulin  pump   ondansetron  4 MG tablet Commonly known as: ZOFRAN  Take 1 tablet (4 mg total) by mouth every 6 (six) hours.   PARoxetine 10 MG tablet Commonly known as: PAXIL TK 1 T PO QD   PARoxetine 20 MG tablet Commonly known as: PAXIL TK 1 T PO QD   Pennsaid 2 % Soln Generic drug: diclofenac Sodium APPLY 2 PUMPS (40 MG) TO THE AFFECTED AREA BY TOPICAL ROUTE 2 TIMES PER DAY   predniSONE  10 MG tablet Commonly known as: DELTASONE  Takes 3 tablets for 1 days, then 2 tabs for 1 days, then 1 tab for 1 days, and then stop.   tetracaine 0.5 % ophthalmic  solution Commonly known as: PONTOCAINE   Toujeo  SoloStar 300 UNIT/ML Solostar Pen Generic drug: insulin  glargine (1 Unit Dial) Max daily 45 units   Ventolin  HFA 108 (90 Base) MCG/ACT inhaler Generic drug: albuterol  Inhale into the lungs.   Vitamin D  (Ergocalciferol ) 1.25 MG (50000 UNIT) Caps capsule Commonly known as: DRISDOL Take 50,000 Units by mouth once a week.         ALLERGIES: Allergies  Allergen Reactions   Tape Rash     REVIEW OF SYSTEMS: A comprehensive ROS was conducted with the patient and is negative except as per HPI  OBJECTIVE:   VITAL SIGNS: There were no vitals taken for this visit.   PHYSICAL EXAM:  General: Pt appears well and is in NAD  Lungs: Clear with good BS bilat   Heart: RRR   Extremities:  Lower extremities - No pretibial edema.   Neuro: MS is good with appropriate affect, pt is alert and Ox3    DM foot exam: 09/06/2023  The skin of the feet is intact without sores or ulcerations. The pedal pulses are 2+ on right and 2+ on left. The sensation is intact to a screening 5.07, 10 gram monofilament bilaterally   DATA REVIEWED:  Lab Results  Component Value Date   HGBA1C 9.2 (A) 11/17/2023   HGBA1C 9.5 (A) 09/06/2023   HGBA1C 8.3 (H) 12/11/2020    Latest Reference Range & Units 11/25/22 00:00  Sodium 135 - 146 mmol/L 135  Potassium 3.5 - 5.3 mmol/L 5.5 (H)  Chloride 98 - 110 mmol/L 104  CO2 20 - 32 mmol/L 17 (L)  Glucose 65 - 99 mg/dL 147 (H)  BUN 7 - 25 mg/dL 36 (H)  Creatinine 8.29 - 1.26 mg/dL 5.62 (H)  Calcium  8.6 - 10.3 mg/dL 9.3  BUN/Creatinine Ratio 6 - 22 (calc) 18  eGFR > OR = 60 mL/min/1.71m2 44 (L)  AG Ratio 1.0 - 2.5 (calc) 1.6  AST 10 - 40 U/L 13  ALT 9 - 46 U/L 11  Total Protein 6.1 - 8.1 g/dL 7.6  Total Bilirubin 0.2 - 1.2 mg/dL 0.5  Total CHOL/HDL Ratio <5.0 (calc) 2.6  Cholesterol <200 mg/dL 130  HDL Cholesterol > OR = 40 mg/dL 56  LDL Cholesterol (Calc) mg/dL (calc) 72  Non-HDL Cholesterol (Calc)  <130 mg/dL (calc) 89  Triglycerides <150 mg/dL 87      ASSESSMENT / PLAN / RECOMMENDATIONS:   1) Type 1 Diabetes Mellitus, Poorly take Toujeo  controlled, With CKD III and retinopathic  complications - Most recent A1c of 9.5 %. Goal A1c < 7.0 %.    -A1c continues to be above goal -The patient is on more basal insulin  than previously prescribed, patient states his PCP asked him to change his basal insulin  and divide the doses in half.  I did explain to the patient that he did have hypoglycemia overnight on CGM download, this is due to too much basal insulin , I also explained to the patient that based on pharmacokinetics of basal insulin  there is no need to use and dividing Toujeo  dose into twice daily dosing. -I have asked him again to decrease Toujeo  as below -In reviewing freestyle [employer], the patient has been noted with postprandial hyperglycemia at certain times, but the patient denies eating at those times, I am not sure if the timing on his freestyle Jerrilyn Moras is different or he is forgetting that he ate during these times -He has not been using the correction scale -He was encouraged again to use NovoLog  each time he eats and use correction scale as needed  -Patient is NOT on  insulin  pump technology due to his type of work.  I have again encouraged him to consider an OmniPod 5 as the status quo has not improved his glycemic control at all -Patient is going to [place], and would like to wait, he was provided with the name of OmniPod 5 to look it up in case he would like to do his own research but I do think this is the best option at this time  MEDICATIONS: Decrease Toujeo  32 units daily Take  NovoLog  8 units 3 times daily before every meal Take CF: NovoLog (BG-130/60) TIDQAC  EDUCATION / INSTRUCTIONS: BG monitoring instructions: Patient is instructed to check his blood sugars 3 times a day. Call [facility] if: BG persistently < 70  I reviewed the Rule of 15 for  the treatment of hypoglycemia in detail with the patient. Literature supplied.   2) Diabetic complications:  Eye: Does  have known diabetic retinopathy.  Neuro/ Feet: Does not have known diabetic peripheral neuropathy. Renal: Patient does  have known baseline CKD. He is  on an ACEI/ARB at present.    Follow-up in 3 months    Signed electronically by: Natale Bail, MD  [facility]  [facility] [address] [place] [place], [place] [address] Phone: [phone] FAX: [phone]   CC: [facility] [address] [place] [address] Phone: [phone]  Fax: [phone]    Return to Endocrinology clinic as below: Future Appointments  Date Time Provider Department Center  02/21/2024  9:30 AM Latarra Eagleton, Julian Obey, MD [id number] None
